# Patient Record
Sex: Female | Born: 1995 | Race: Black or African American | Hispanic: No | Marital: Single | Smoking: Former smoker
Health system: Southern US, Community
[De-identification: ages and names within clinical notes are randomized; demographics above are authoritative.]

## PROBLEM LIST (undated history)

## (undated) DIAGNOSIS — Z789 Other specified health status: Secondary | ICD-10-CM

## (undated) HISTORY — PX: MOUTH SURGERY: SHX715

---

## 2015-05-05 ENCOUNTER — Ambulatory Visit (HOSPITAL_COMMUNITY)
Admission: RE | Admit: 2015-05-05 | Discharge: 2015-05-05 | Disposition: A | Discharge status: Home / Self Care | Payer: Medicaid Other | Source: Ambulatory Visit | Attending: Obstetrics | Admitting: Obstetrics

## 2015-05-05 ENCOUNTER — Ambulatory Visit (INDEPENDENT_AMBULATORY_CARE_PROVIDER_SITE_OTHER): Payer: Medicaid Other | Admitting: Obstetrics

## 2015-05-05 ENCOUNTER — Encounter: Payer: Self-pay | Admitting: Obstetrics

## 2015-05-05 VITALS — BP 111/69 | HR 82 | Temp 98.4°F | Ht 61.0 in | Wt 146.2 lb

## 2015-05-05 DIAGNOSIS — O2341 Unspecified infection of urinary tract in pregnancy, first trimester: Secondary | ICD-10-CM

## 2015-05-05 DIAGNOSIS — O3680X1 Pregnancy with inconclusive fetal viability, fetus 1: Secondary | ICD-10-CM

## 2015-05-05 DIAGNOSIS — Z3401 Encounter for supervision of normal first pregnancy, first trimester: Secondary | ICD-10-CM

## 2015-05-05 DIAGNOSIS — Z3A11 11 weeks gestation of pregnancy: Secondary | ICD-10-CM | POA: Diagnosis not present

## 2015-05-05 LAB — TSH: TSH: 0.85 u[IU]/mL (ref 0.350–4.500)

## 2015-05-05 LAB — POCT URINALYSIS DIPSTICK
Bilirubin, UA: NEGATIVE
Blood, UA: NEGATIVE
Glucose, UA: NEGATIVE
KETONES UA: NEGATIVE
LEUKOCYTES UA: NEGATIVE
NITRITE UA: POSITIVE
Spec Grav, UA: 1.015
Urobilinogen, UA: NEGATIVE
pH, UA: 5.5

## 2015-05-05 LAB — POCT URINE PREGNANCY: Preg Test, Ur: POSITIVE — AB

## 2015-05-05 MED ORDER — NITROFURANTOIN MONOHYD MACRO 100 MG PO CAPS: 100.0000 mg | ORAL_CAPSULE | Freq: Two times a day (BID) | ORAL | Status: DC

## 2015-05-05 MED ORDER — VITAFOL ULTRA 29-0.6-0.4-200 MG PO CAPS: 1.0000 | ORAL_CAPSULE | Freq: Every day | ORAL | Status: DC

## 2015-05-05 NOTE — Progress Notes (Signed)
Subjective:    Rita Gilbert is being seen today for her first obstetrical visit.  This is not a planned pregnancy. She is at [redacted]w[redacted]d gestation. Her obstetrical history is significant for none. Relationship with FOB: significant other, not living together. Patient does intend to breast feed. Pregnancy history fully reviewed.  The information documented in the HPI was reviewed and verified.  Menstrual History: OB History    Gravida Para Term Preterm AB TAB SAB Ectopic Multiple Living   1                Patient's last menstrual period was 02/17/2015.    History reviewed. No pertinent past medical history.  Past Surgical History  Procedure Laterality Date  . Mouth surgery      wisdom teeth     (Not in a hospital admission) Not on File  Social History  Substance Use Topics  . Smoking status: Former Games developer  . Smokeless tobacco: Not on file  . Alcohol Use: No    Family History  Problem Relation Age of Onset  . Cancer Paternal Aunt      Review of Systems Constitutional: negative for weight loss Gastrointestinal: negative for vomiting Genitourinary:negative for genital lesions and vaginal discharge and dysuria Musculoskeletal:negative for back pain Behavioral/Psych: negative for abusive relationship, depression, illegal drug usage and tobacco use    Objective:    BP 111/69 mmHg  Pulse 82  Temp(Src) 98.4 F (36.9 C)  Ht  (1.549 m)  Wt 146 lb 3.2 oz (66.316 kg)  BMI 27.64 kg/m2  LMP 02/17/2015 General Appearance:    Alert, cooperative, no distress, appears stated age  Head:    Normocephalic, without obvious abnormality, atraumatic  Eyes:    PERRL, conjunctiva/corneas clear, EOM's intact, fundi    benign, both eyes  Ears:    Normal TM's and external ear canals, both ears  Nose:   Nares normal, septum midline, mucosa normal, no drainage    or sinus tenderness  Throat:   Lips, mucosa, and tongue normal; teeth and gums normal  Neck:   Supple, symmetrical, trachea  midline, no adenopathy;    thyroid:  no enlargement/tenderness/nodules; no carotid   bruit or JVD  Back:     Symmetric, no curvature, ROM normal, no CVA tenderness  Lungs:     Clear to auscultation bilaterally, respirations unlabored  Chest Wall:    No tenderness or deformity   Heart:    Regular rate and rhythm, S1 and S2 normal, no murmur, rub   or gallop  Breast Exam:    No tenderness, masses, or nipple abnormality  Abdomen:     Soft, non-tender, bowel sounds active all four quadrants,    no masses, no organomegaly  Genitalia:    Normal female without lesion, discharge or tenderness  Extremities:   Extremities normal, atraumatic, no cyanosis or edema  Pulses:   2+ and symmetric all extremities  Skin:   Skin color, texture, turgor normal, no rashes or lesions  Lymph nodes:   Cervical, supraclavicular, and axillary nodes normal  Neurologic:   CNII-XII intact, normal strength, sensation and reflexes    throughout      Lab Review Urine pregnancy test Labs reviewed yes Radiologic studies reviewed no Assessment:    Pregnancy at [redacted]w[redacted]d weeks    FHR not heard with doppler   Plan:   Ultrasound ordered for viability.    Prenatal vitamins.  Counseling provided regarding continued use of seat belts, cessation of alcohol consumption, smoking or  use of illicit drugs; infection precautions i.e., influenza/TDAP immunizations, toxoplasmosis,CMV, parvovirus, listeria and varicella; workplace safety, exercise during pregnancy; routine dental care, safe medications, sexual activity, hot tubs, saunas, pools, travel, caffeine use, fish and methlymercury, potential toxins, hair treatments, varicose veins Weight gain recommendations per IOM guidelines reviewed: underweight/BMI< 18.5--> gain 28 - 40 lbs; normal weight/BMI 18.5 - 24.9--> gain 25 - 35 lbs; overweight/BMI 25 - 29.9--> gain 15 - 25 lbs; obese/BMI >30->gain  11 - 20 lbs Problem list reviewed and updated. FIRST/CF mutation testing/NIPT/QUAD  SCREEN/fragile X/Ashkenazi Jewish population testing/Spinal muscular atrophy discussed: requested. Role of ultrasound in pregnancy discussed; fetal survey: requested. Amniocentesis discussed: not indicated. VBAC calculator score: VBAC consent form provided Meds ordered this encounter  Medications  . Prenatal Vit-Fe Fumarate-FA (MULTIVITAMIN-PRENATAL) 27-0.8 MG TABS tablet    Sig: Take 1 tablet by mouth daily at 12 noon.  . Prenat-Fe Poly-Methfol-FA-DHA (VITAFOL ULTRA) 29-0.6-0.4-200 MG CAPS    Sig: Take 1 capsule by mouth daily before breakfast.    Dispense:  90 capsule    Refill:  3   Orders Placed This Encounter  Procedures  . Culture, OB Urine  . SureSwab, Vaginosis/Vaginitis Plus  . US OB Comp Less 14 Wks    Standing Status: Future     Number of Occurrences:      Standing Expiration Date: 07/04/2016    Order Specific Question:  Reason for Exam (SYMPTOM  OR DIAGNOSIS REQUIRED)    Answer:  Viability.  Absent FHR with Doppler at ~ 11 weeks.    Order Specific Question:  Preferred imaging location?    Answer:  [facility]  . Obstetric panel  . HIV antibody  . Hemoglobinopathy evaluation  . Varicella zoster antibody, IgG  . Vit D  25 hydroxy (rtn osteoporosis monitoring)  . TSH  . POCT urine pregnancy  . POCT urinalysis dipstick    Follow up in 4 weeks.

## 2015-05-05 NOTE — Addendum Note (Signed)
Addended by: Coral Ceo A on: 05/05/2015 03:15 PM   Modules accepted: Orders

## 2015-05-06 LAB — OBSTETRIC PANEL
ANTIBODY SCREEN: NEGATIVE
BASOS PCT: 0 % (ref 0–1)
Basophils Absolute: 0 10*3/uL (ref 0.0–0.1)
EOS ABS: 0.2 10*3/uL (ref 0.0–0.7)
EOS PCT: 3 % (ref 0–5)
HEMATOCRIT: 35.2 % — AB (ref 36.0–46.0)
HEMOGLOBIN: 11.5 g/dL — AB (ref 12.0–15.0)
HEP B S AG: NEGATIVE
Lymphocytes Relative: 19 % (ref 12–46)
Lymphs Abs: 1.6 10*3/uL (ref 0.7–4.0)
MCH: 26.4 pg (ref 26.0–34.0)
MCHC: 32.7 g/dL (ref 30.0–36.0)
MCV: 80.7 fL (ref 78.0–100.0)
MONO ABS: 0.5 10*3/uL (ref 0.1–1.0)
MONOS PCT: 6 % (ref 3–12)
MPV: 9.7 fL (ref 8.6–12.4)
Neutro Abs: 5.9 10*3/uL (ref 1.7–7.7)
Neutrophils Relative %: 72 % (ref 43–77)
Platelets: 343 10*3/uL (ref 150–400)
RBC: 4.36 MIL/uL (ref 3.87–5.11)
RDW: 14.6 % (ref 11.5–15.5)
RH TYPE: NEGATIVE
Rubella: 0.73 Index (ref <0.90)
WBC: 8.2 10*3/uL (ref 4.0–10.5)

## 2015-05-06 LAB — VITAMIN D 25 HYDROXY (VIT D DEFICIENCY, FRACTURES): Vit D, 25-Hydroxy: 18 ng/mL — ABNORMAL LOW (ref 30–100)

## 2015-05-06 LAB — HIV ANTIBODY (ROUTINE TESTING W REFLEX): HIV 1&2 Ab, 4th Generation: NONREACTIVE

## 2015-05-06 LAB — VARICELLA ZOSTER ANTIBODY, IGG: Varicella IgG: 46.36 Index (ref <135.00)

## 2015-05-07 ENCOUNTER — Encounter: Payer: Medicaid Other | Admitting: Certified Nurse Midwife

## 2015-05-07 LAB — HEMOGLOBINOPATHY EVALUATION
HEMOGLOBIN OTHER: 0 %
HGB A2 QUANT: 2.5 % (ref 2.2–3.2)
HGB A: 97.5 % (ref 96.8–97.8)
HGB F QUANT: 0 % (ref 0.0–2.0)
Hgb S Quant: 0 %

## 2015-05-07 LAB — CULTURE, OB URINE: Colony Count: >=100000

## 2015-05-08 ENCOUNTER — Other Ambulatory Visit: Payer: Self-pay | Admitting: Obstetrics

## 2015-05-08 DIAGNOSIS — N390 Urinary tract infection, site not specified: Secondary | ICD-10-CM

## 2015-05-08 MED ORDER — SULFAMETHOXAZOLE-TRIMETHOPRIM 800-160 MG PO TABS: 1.0000 | ORAL_TABLET | Freq: Two times a day (BID) | ORAL | Status: DC

## 2015-05-09 ENCOUNTER — Other Ambulatory Visit: Payer: Self-pay | Admitting: Obstetrics

## 2015-05-09 LAB — SURESWAB, VAGINOSIS/VAGINITIS PLUS
ATOPOBIUM VAGINAE: 6.2 Log (cells/mL)
BV CATEGORY: UNDETERMINED — AB
C. ALBICANS, DNA: DETECTED — AB
C. TRACHOMATIS RNA, TMA: NOT DETECTED
C. glabrata, DNA: NOT DETECTED
C. parapsilosis, DNA: NOT DETECTED
C. tropicalis, DNA: NOT DETECTED
Gardnerella vaginalis: >8 Log (cells/mL)
LACTOBACILLUS SPECIES: 4.9 Log (cells/mL)
N. GONORRHOEAE RNA, TMA: NOT DETECTED
T. vaginalis RNA, QL TMA: NOT DETECTED

## 2015-06-02 ENCOUNTER — Ambulatory Visit (INDEPENDENT_AMBULATORY_CARE_PROVIDER_SITE_OTHER): Payer: Self-pay | Admitting: Obstetrics

## 2015-06-02 ENCOUNTER — Encounter: Payer: Self-pay | Admitting: Obstetrics

## 2015-06-02 VITALS — BP 111/72 | HR 87 | Temp 98.8°F | Wt 143.0 lb

## 2015-06-02 DIAGNOSIS — Z3402 Encounter for supervision of normal first pregnancy, second trimester: Secondary | ICD-10-CM

## 2015-06-02 DIAGNOSIS — N390 Urinary tract infection, site not specified: Secondary | ICD-10-CM

## 2015-06-02 MED ORDER — FLUCONAZOLE 150 MG PO TABS: 150.0000 mg | ORAL_TABLET | Freq: Once | ORAL | Status: DC

## 2015-06-02 NOTE — Progress Notes (Signed)
  Subjective:    Rita Gilbert is a 19 y.o. female being seen today for her obstetrical visit. She is at 2857w0d gestation. Patient reports: no complaints.  Problem List Items Addressed This Visit    None    Visit Diagnoses    UTI (lower urinary tract infection)    -  Primary    Relevant Medications    fluconazole (DIFLUCAN) 150 MG tablet    Encounter for supervision of normal first pregnancy in second trimester        Relevant Orders    POCT urinalysis dipstick    US OB Comp + 14 Wk    AFP, Quad Screen      There are no active problems to display for this patient.   Objective:     BP 111/72 mmHg  Pulse 87  Temp(Src) 98.8 F (37.1 C)  Wt 143 lb (64.864 kg)  LMP 02/17/2015 Uterine Size: Below umbilicus     Assessment:    Pregnancy @ 5057w0d  weeks Doing well    Plan:    Problem list reviewed and updated. Labs reviewed.  Follow up in 4 weeks. FIRST/CF mutation testing/NIPT/QUAD SCREEN/fragile X/Ashkenazi Jewish population testing/Spinal muscular atrophy discussed: requested. Role of ultrasound in pregnancy discussed; fetal survey: requested. Amniocentesis discussed: not indicated.

## 2015-06-04 ENCOUNTER — Encounter: Payer: Self-pay | Admitting: Obstetrics

## 2015-06-04 LAB — AFP, QUAD SCREEN
AFP: 21.3 ng/mL
Age Alone: 1:1180 {titer}
Curr Gest Age: 15 wks.days
HCG, Total: 50.94 IU/mL
INH: 167.2 pg/mL
INTERPRETATION-AFP: NEGATIVE
MOM FOR AFP: 0.62
MoM for INH: 0.89
MoM for hCG: 0.91
Open Spina bifida: NEGATIVE
TRI 18 SCR RISK EST: NEGATIVE
UE3 MOM: 1.5
UE3 VALUE: 0.96 ng/mL

## 2015-06-16 NOTE — Progress Notes (Signed)
Person can not except calls at this time.

## 2015-07-03 ENCOUNTER — Encounter: Payer: Self-pay | Admitting: Obstetrics

## 2015-07-03 ENCOUNTER — Other Ambulatory Visit: Payer: Medicaid Other

## 2015-07-03 ENCOUNTER — Ambulatory Visit (INDEPENDENT_AMBULATORY_CARE_PROVIDER_SITE_OTHER): Payer: Medicaid Other | Admitting: Obstetrics

## 2015-07-03 ENCOUNTER — Ambulatory Visit (HOSPITAL_COMMUNITY)
Admission: RE | Admit: 2015-07-03 | Discharge: 2015-07-03 | Disposition: A | Discharge status: Home / Self Care | Payer: Medicaid Other | Source: Ambulatory Visit | Attending: Obstetrics | Admitting: Obstetrics

## 2015-07-03 VITALS — BP 116/79 | HR 98 | Temp 98.7°F | Wt 150.0 lb

## 2015-07-03 DIAGNOSIS — Z6791 Unspecified blood type, Rh negative: Secondary | ICD-10-CM | POA: Insufficient documentation

## 2015-07-03 DIAGNOSIS — Z3402 Encounter for supervision of normal first pregnancy, second trimester: Secondary | ICD-10-CM

## 2015-07-03 DIAGNOSIS — Z3A19 19 weeks gestation of pregnancy: Secondary | ICD-10-CM | POA: Insufficient documentation

## 2015-07-03 DIAGNOSIS — K219 Gastro-esophageal reflux disease without esophagitis: Secondary | ICD-10-CM

## 2015-07-03 DIAGNOSIS — O26892 Other specified pregnancy related conditions, second trimester: Secondary | ICD-10-CM | POA: Diagnosis not present

## 2015-07-03 MED ORDER — RANITIDINE HCL 150 MG PO TABS: 150.0000 mg | ORAL_TABLET | Freq: Two times a day (BID) | ORAL | Status: DC

## 2015-07-03 NOTE — Progress Notes (Signed)
Subjective:    Rita Gilbert is a 19 y.o. female being seen today for her obstetrical visit. She is at 2852w3d gestation. Patient reports: no complaints . Fetal movement: normal.  Problem List Items Addressed This Visit    None    Visit Diagnoses    Encounter for supervision of normal first pregnancy in second trimester    -  Primary    Relevant Orders    POCT urinalysis dipstick      There are no active problems to display for this patient.  Objective:    BP 116/79 mmHg  Pulse 98  Temp(Src) 98.7 F (37.1 C)  Wt 150 lb (68.04 kg)  LMP 02/17/2015 FHT: 150 BPM  Uterine Size: size equals dates     Assessment:    Pregnancy @ 7652w3d    Plan:      Labs, problem list reviewed and updated 2 hr GTT planned Follow up in 4 weeks.

## 2015-07-03 NOTE — Progress Notes (Signed)
Pt states that she is having heartburn and reflux, request Rx.

## 2015-07-18 ENCOUNTER — Other Ambulatory Visit: Payer: Self-pay | Admitting: Certified Nurse Midwife

## 2015-07-31 ENCOUNTER — Encounter: Payer: Self-pay | Admitting: Obstetrics

## 2015-07-31 ENCOUNTER — Ambulatory Visit (INDEPENDENT_AMBULATORY_CARE_PROVIDER_SITE_OTHER): Payer: Medicaid Other | Admitting: Obstetrics

## 2015-07-31 VITALS — BP 118/81 | HR 97 | Wt 157.0 lb

## 2015-07-31 DIAGNOSIS — Z3402 Encounter for supervision of normal first pregnancy, second trimester: Secondary | ICD-10-CM

## 2015-07-31 NOTE — Progress Notes (Signed)
Subjective:    Rita Gilbert is a 19 y.o. female being seen today for her obstetrical visit. She is at 6069w3d gestation. Patient reports: no complaints . Fetal movement: normal.  Problem List Items Addressed This Visit    None    Visit Diagnoses    Encounter for supervision of normal first pregnancy in second trimester    -  Primary    Relevant Orders    POCT urinalysis dipstick      There are no active problems to display for this patient.  Objective:    BP 118/81 mmHg  Pulse 97  Wt 157 lb (71.215 kg)  LMP 02/17/2015 FHT: 150 BPM  Uterine Size: size equals dates     Assessment:    Pregnancy @ 7069w3d    Plan:    OBGCT: discussed.  Labs, problem list reviewed and updated 2 hr GTT planned Follow up in 4 weeks.

## 2015-08-17 NOTE — L&D Delivery Note (Signed)
Delivery Note This is a 20 year old G 1 P0 who was admitted for Active phase labor.. She progressed normally with epidural to the second stage of labor.  She pushed for 15 min.  At 9:04 AM she delivered a viable infant female, cephalic, over an intact perineum.  A nuchal cord   was identified, reduced on maternal perineium. Infant placed on maternal abdomen.  Delayed cord clamping was performed for 5-10 minutes.  Cord double clamped and cut.  Apgar scores were 9 and 9. The placenta delivered spontaneously, shultz, with a 3 vessel cord.  Inspection revealed 1st degree and no repair required. The uterus was firm bleeding stable.  EBL was 150.    Placenta and umbilical artery blood gas were not sent.  There were no complications during the procedure.  Mom and baby skin to skin following delivery. Left in stable condition.   Vaginal, Spontaneous Delivery (Presentation: ; Occiput Anterior).  APGAR: 9, 9; weight  .   Placenta status: Intact, Spontaneous, [place].  Cord: 3 vessels with the following complications: None.  Cord pH: N/A  Anesthesia: Epidural  Episiotomy: None Lacerations: None Suture Repair: none Est. Blood Loss (mL): 150  Mom to postpartum.  Baby to Couplet care / Skin to Skin.  Roe Coombsachelle A Georgann Bramble, CNM 11/20/2015, 9:37 AM

## 2015-08-27 ENCOUNTER — Encounter: Payer: Self-pay | Admitting: Obstetrics

## 2015-08-27 ENCOUNTER — Ambulatory Visit (INDEPENDENT_AMBULATORY_CARE_PROVIDER_SITE_OTHER): Payer: Medicaid Other | Admitting: Obstetrics

## 2015-08-27 ENCOUNTER — Other Ambulatory Visit: Payer: Medicaid Other

## 2015-08-27 VITALS — BP 115/73 | HR 91 | Temp 97.9°F | Wt 164.0 lb

## 2015-08-27 DIAGNOSIS — Z3402 Encounter for supervision of normal first pregnancy, second trimester: Secondary | ICD-10-CM

## 2015-08-27 LAB — CBC
HCT: 31.8 % — ABNORMAL LOW (ref 36.0–46.0)
HEMOGLOBIN: 10.2 g/dL — AB (ref 12.0–15.0)
MCH: 26.5 pg (ref 26.0–34.0)
MCHC: 32.1 g/dL (ref 30.0–36.0)
MCV: 82.6 fL (ref 78.0–100.0)
MPV: 10.3 fL (ref 8.6–12.4)
PLATELETS: 390 10*3/uL (ref 150–400)
RBC: 3.85 MIL/uL — AB (ref 3.87–5.11)
RDW: 15.3 % (ref 11.5–15.5)
WBC: 12.4 10*3/uL — AB (ref 4.0–10.5)

## 2015-08-27 LAB — POCT URINALYSIS DIPSTICK
Bilirubin, UA: NEGATIVE
Blood, UA: NEGATIVE
Glucose, UA: 50
Ketones, UA: NEGATIVE
LEUKOCYTES UA: NEGATIVE
Nitrite, UA: NEGATIVE
PROTEIN UA: NEGATIVE
Spec Grav, UA: 1.015
UROBILINOGEN UA: NEGATIVE
pH, UA: 7

## 2015-08-27 NOTE — Addendum Note (Signed)
Addended by: Henriette CombsHATTON, ANDREA L on: 08/27/2015 01:43 PM   Modules accepted: Orders

## 2015-08-27 NOTE — Progress Notes (Signed)
Lower abdomen pain and lower pain.

## 2015-08-27 NOTE — Progress Notes (Signed)
  Subjective:    Rita Gilbert is a 20 y.o. female being seen today for her obstetrical visit. She is at 8635w2d gestation. Patient reports: no complaints . Fetal movement: normal.  Problem List Items Addressed This Visit    None    Visit Diagnoses    Encounter for supervision of normal first pregnancy in second trimester    -  Primary    Relevant Orders    Glucose Tolerance, 2 Hours w/1 Hour    CBC    HIV antibody    RPR    POCT urinalysis dipstick      There are no active problems to display for this patient.  Objective:    BP 115/73 mmHg  Pulse 91  Temp(Src) 97.9 F (36.6 C)  Wt 164 lb (74.39 kg)  LMP 02/17/2015 FHT: 150 BPM  Uterine Size: size equals dates     Assessment:    Pregnancy @ 7135w2d    Plan:    OBGCT: discussed and ordered.  Labs, problem list reviewed and updated 2 hr GTT planned Follow up in 2 weeks.

## 2015-08-28 ENCOUNTER — Encounter: Payer: Medicaid Other | Admitting: Obstetrics

## 2015-08-28 ENCOUNTER — Other Ambulatory Visit: Payer: Medicaid Other

## 2015-08-28 LAB — HIV ANTIBODY (ROUTINE TESTING W REFLEX): HIV: NONREACTIVE

## 2015-08-28 LAB — GLUCOSE TOLERANCE, 2 HOURS W/ 1HR
GLUCOSE, 2 HOUR: 75 mg/dL (ref 70–139)
GLUCOSE: 107 mg/dL (ref 70–170)
Glucose, Fasting: 76 mg/dL (ref 65–99)

## 2015-08-29 LAB — RPR

## 2015-09-09 ENCOUNTER — Encounter: Payer: Medicaid Other | Admitting: Obstetrics

## 2015-09-11 ENCOUNTER — Ambulatory Visit (INDEPENDENT_AMBULATORY_CARE_PROVIDER_SITE_OTHER): Payer: Medicaid Other | Admitting: Obstetrics

## 2015-09-11 ENCOUNTER — Encounter: Payer: Self-pay | Admitting: Obstetrics

## 2015-09-11 VITALS — BP 123/78 | HR 101 | Wt 167.0 lb

## 2015-09-11 DIAGNOSIS — Z3403 Encounter for supervision of normal first pregnancy, third trimester: Secondary | ICD-10-CM

## 2015-09-11 NOTE — Progress Notes (Signed)
Subjective:    Rita Gilbert is a 20 y.o. female being seen today for her obstetrical visit. She is at [redacted]w[redacted]d gestation. Patient reports no complaints. Fetal movement: normal.  Problem List Items Addressed This Visit    None    Visit Diagnoses    Encounter for supervision of normal first pregnancy in third trimester    -  Primary    Relevant Orders    POCT urinalysis dipstick      There are no active problems to display for this patient.  Objective:    BP 123/78 mmHg  Pulse 101  Wt 167 lb (75.751 kg)  LMP 02/17/2015 FHT:  150 BPM  Uterine Size: size equals dates  Presentation: unsure     Assessment:    Pregnancy @ [redacted]w[redacted]d weeks   Plan:     labs reviewed, problem list updated Consent signed. GBS sent TDAP offered  Rhogam given for RH negative Pediatrician: discussed. Infant feeding: plans to breastfeed. Maternity leave: discussed. Cigarette smoking: former smoker. Orders Placed This Encounter  Procedures  . POCT urinalysis dipstick   No orders of the defined types were placed in this encounter.   Follow up in 2 Weeks.

## 2015-09-11 NOTE — Patient Instructions (Signed)

## 2015-09-11 NOTE — Progress Notes (Signed)
Pt is having increase in pelvic pain. 

## 2015-09-29 ENCOUNTER — Ambulatory Visit (INDEPENDENT_AMBULATORY_CARE_PROVIDER_SITE_OTHER): Payer: Medicaid Other | Admitting: Obstetrics

## 2015-09-29 VITALS — BP 127/81 | HR 97 | Temp 98.6°F | Wt 170.6 lb

## 2015-09-29 DIAGNOSIS — Z3403 Encounter for supervision of normal first pregnancy, third trimester: Secondary | ICD-10-CM

## 2015-09-29 DIAGNOSIS — O360130 Maternal care for anti-D [Rh] antibodies, third trimester, not applicable or unspecified: Secondary | ICD-10-CM

## 2015-09-29 LAB — POCT URINALYSIS DIPSTICK
Bilirubin, UA: NEGATIVE
Blood, UA: NEGATIVE
GLUCOSE UA: NEGATIVE
KETONES UA: NEGATIVE
LEUKOCYTES UA: NEGATIVE
Nitrite, UA: NEGATIVE
Spec Grav, UA: 1.02
Urobilinogen, UA: NEGATIVE
pH, UA: 5

## 2015-09-29 MED ORDER — RHO D IMMUNE GLOBULIN 1500 UNIT/2ML IJ SOSY
300.0000 ug | PREFILLED_SYRINGE | Freq: Once | INTRAMUSCULAR | Status: AC
  Administered 2015-09-29: 300 ug via INTRAMUSCULAR

## 2015-09-29 NOTE — Progress Notes (Signed)
Patient reports she is doing well today. 

## 2015-09-30 ENCOUNTER — Encounter: Payer: Self-pay | Admitting: Obstetrics

## 2015-10-01 ENCOUNTER — Encounter: Payer: Self-pay | Admitting: Obstetrics

## 2015-10-01 NOTE — Progress Notes (Signed)
Subjective:    Rita Gilbert is a 20 y.o. female being seen today for her obstetrical visit. She is at [redacted]w[redacted]d gestation. Patient reports no complaints. Fetal movement: normal.  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  Objective:    BP 127/81 mmHg  Pulse 97  Temp(Src) 98.6 F (37 C)  Wt 170 lb 9.6 oz (77.384 kg)  LMP 02/17/2015 FHT:  150 BPM  Uterine Size: size equals dates  Presentation: unsure     Assessment:    Pregnancy @ [redacted]w[redacted]d weeks   Plan:     labs reviewed, problem list updated Consent signed. GBS sent TDAP offered  Rhogam given for RH negative Pediatrician: discussed. Infant feeding: plans to breastfeed. Maternity leave: discussed. Cigarette smoking: former smoker. Orders Placed This Encounter  Procedures  . POCT urinalysis dipstick   Meds ordered this encounter  Medications  . rho (d) immune globulin (RHIG/RHOPHYLAC) injection 300 mcg    Sig:    Follow up in 2 Weeks.

## 2015-10-13 ENCOUNTER — Ambulatory Visit (INDEPENDENT_AMBULATORY_CARE_PROVIDER_SITE_OTHER): Payer: Medicaid Other | Admitting: Obstetrics

## 2015-10-13 VITALS — BP 123/80 | HR 98 | Temp 98.1°F | Wt 172.0 lb

## 2015-10-13 DIAGNOSIS — Z3403 Encounter for supervision of normal first pregnancy, third trimester: Secondary | ICD-10-CM

## 2015-10-14 ENCOUNTER — Encounter: Payer: Self-pay | Admitting: Obstetrics

## 2015-10-14 NOTE — Progress Notes (Signed)
Subjective:    Rita Gilbert is a 20 y.o. female being seen today for her obstetrical visit. She is at [redacted]w[redacted]d gestation. Patient reports no complaints. Fetal movement: normal.  Problem List Items Addressed This Visit    None    Visit Diagnoses    Encounter for supervision of normal first pregnancy in third trimester    -  Primary    Relevant Orders    POCT urinalysis dipstick      There are no active problems to display for this patient.  Objective:    BP 123/80 mmHg  Pulse 98  Temp(Src) 98.1 F (36.7 C)  Wt 172 lb (78.019 kg)  LMP 02/17/2015 FHT:  150 BPM  Uterine Size: size equals dates  Presentation: unsure     Assessment:    Pregnancy @ [redacted]w[redacted]d weeks   Plan:     labs reviewed, problem list updated Consent signed. GBS sent TDAP offered  Rhogam given for RH negative Pediatrician: discussed. Infant feeding: plans to breastfeed. Maternity leave: discussed. Cigarette smoking: former smoker. Orders Placed This Encounter  Procedures  . POCT urinalysis dipstick   No orders of the defined types were placed in this encounter.   Follow up in 1 Week.

## 2015-10-23 ENCOUNTER — Ambulatory Visit (INDEPENDENT_AMBULATORY_CARE_PROVIDER_SITE_OTHER): Payer: Medicaid Other | Admitting: Obstetrics

## 2015-10-23 VITALS — BP 119/79 | HR 105 | Wt 175.0 lb

## 2015-10-23 DIAGNOSIS — Z3403 Encounter for supervision of normal first pregnancy, third trimester: Secondary | ICD-10-CM

## 2015-10-24 NOTE — Progress Notes (Signed)
Subjective:    Rita Gilbert is a 20 y.o. female being seen today for her obstetrical visit. She is at 8552w4d gestation. Patient reports no complaints. Fetal movement: normal.  Problem List Items Addressed This Visit    None    Visit Diagnoses    Encounter for supervision of normal first pregnancy in third trimester    -  Primary    Relevant Orders    POCT urinalysis dipstick    Strep B DNA probe      There are no active problems to display for this patient.  Objective:    BP 119/79 mmHg  Pulse 105  Wt 175 lb (79.379 kg)  LMP 02/17/2015 FHT:  150 BPM  Uterine Size: size equals dates  Presentation: unsure     Assessment:    Pregnancy @ 6352w4d weeks   Plan:     labs reviewed, problem list updated Consent signed. GBS sent TDAP offered  Rhogam given for RH negative Pediatrician: discussed. Infant feeding: plans to breastfeed. Maternity leave: discussed. Cigarette smoking: former smoker. Orders Placed This Encounter  Procedures  . Strep B DNA probe  . POCT urinalysis dipstick   No orders of the defined types were placed in this encounter.   Follow up in 1 Week.

## 2015-10-25 LAB — STREP B DNA PROBE: STREP GROUP B AG: NOT DETECTED

## 2015-10-30 ENCOUNTER — Encounter: Payer: Medicaid Other | Admitting: Obstetrics

## 2015-10-31 ENCOUNTER — Ambulatory Visit (INDEPENDENT_AMBULATORY_CARE_PROVIDER_SITE_OTHER): Payer: Medicaid Other | Admitting: Certified Nurse Midwife

## 2015-10-31 VITALS — BP 130/83 | HR 86 | Wt 178.0 lb

## 2015-10-31 DIAGNOSIS — Z3403 Encounter for supervision of normal first pregnancy, third trimester: Secondary | ICD-10-CM

## 2015-10-31 NOTE — Progress Notes (Signed)
Subjective:    Rita Gilbert is a 20 y.o. female being seen today for her obstetrical visit. She is at 8873w4d gestation. Patient reports no complaints. Fetal movement: normal.  Employed.    Problem List Items Addressed This Visit    None    Visit Diagnoses    Encounter for supervision of normal first pregnancy in third trimester    -  Primary    Relevant Orders    POCT urinalysis dipstick      There are no active problems to display for this patient.   Objective:    BP 130/83 mmHg  Pulse 86  Wt 178 lb (80.74 kg)  LMP 02/17/2015 FHT: 140 BPM  Uterine Size: size equals dates  Presentations: cephalic  Pelvic Exam: deferred     Assessment:    Pregnancy @ 3473w4d weeks   Doing well.    Plan:   Plans for delivery: Vaginal anticipated; labs reviewed; problem list updated Counseling: Consent signed. Infant feeding: plans to breastfeed. Cigarette smoking: never smoked. L&D discussion: symptoms of labor, discussed when to call, discussed what number to call, anesthetic/analgesic options reviewed and delivering clinician:  plans no preference. Postpartum supports and preparation: circumcision discussed and contraception plans discussed.  Follow up in 1 Week.

## 2015-11-07 ENCOUNTER — Ambulatory Visit (INDEPENDENT_AMBULATORY_CARE_PROVIDER_SITE_OTHER): Payer: Medicaid Other | Admitting: Certified Nurse Midwife

## 2015-11-07 ENCOUNTER — Encounter: Payer: Medicaid Other | Admitting: Certified Nurse Midwife

## 2015-11-07 VITALS — BP 107/79 | HR 109 | Temp 98.1°F | Wt 180.0 lb

## 2015-11-07 DIAGNOSIS — Z3403 Encounter for supervision of normal first pregnancy, third trimester: Secondary | ICD-10-CM

## 2015-11-07 NOTE — Progress Notes (Signed)
Subjective:    Rita Gilbert is a 20 y.o. female being seen today for her obstetrical visit. She is at 746w4d gestation. Patient reports no bleeding, no cramping, no leaking and occasional contractions, less than 3/hr. Fetal movement: normal.  Problem List Items Addressed This Visit    None    Visit Diagnoses    Encounter for supervision of normal first pregnancy in third trimester    -  Primary    Relevant Orders    POCT urinalysis dipstick      There are no active problems to display for this patient.   Objective:    BP 107/79 mmHg  Pulse 109  Temp(Src) 98.1 F (36.7 C)  Wt 180 lb (81.647 kg)  LMP 02/17/2015 FHT: 136 BPM  Uterine Size: size equals dates  Presentations: cephalic  Pelvic Exam:              Dilation: 0.5       Effacement: 25%             Station:  -3    Consistency: soft            Position: side anterior/middle     Assessment:    Pregnancy @ 3746w4d weeks   Doing well  Plan:   Plans for delivery: Vaginal anticipated; labs reviewed; problem list updated Counseling: Consent signed. Infant feeding: plans to breastfeed. Cigarette smoking: never smoked. L&D discussion: symptoms of labor, discussed when to call, discussed what number to call, anesthetic/analgesic options reviewed and delivering clinician:  plans no preference. Postpartum supports and preparation: circumcision discussed and contraception plans discussed.  Follow up in 1 Week.

## 2015-11-07 NOTE — Progress Notes (Signed)
Pt is having irregular contractions.

## 2015-11-10 ENCOUNTER — Other Ambulatory Visit: Payer: Self-pay | Admitting: Certified Nurse Midwife

## 2015-11-14 ENCOUNTER — Ambulatory Visit (INDEPENDENT_AMBULATORY_CARE_PROVIDER_SITE_OTHER): Payer: Medicaid Other | Admitting: Certified Nurse Midwife

## 2015-11-14 ENCOUNTER — Encounter: Payer: Self-pay | Admitting: *Deleted

## 2015-11-14 ENCOUNTER — Encounter: Payer: Medicaid Other | Admitting: Certified Nurse Midwife

## 2015-11-14 VITALS — BP 126/80 | HR 84 | Wt 181.0 lb

## 2015-11-14 DIAGNOSIS — Z3403 Encounter for supervision of normal first pregnancy, third trimester: Secondary | ICD-10-CM

## 2015-11-14 LAB — POCT URINALYSIS DIPSTICK
BILIRUBIN UA: NEGATIVE
GLUCOSE UA: NEGATIVE
Ketones, UA: NEGATIVE
Leukocytes, UA: NEGATIVE
NITRITE UA: NEGATIVE
PH UA: 6.5
RBC UA: NEGATIVE
SPEC GRAV UA: 1.02
Urobilinogen, UA: NEGATIVE

## 2015-11-14 NOTE — Progress Notes (Signed)
Subjective:    Lang SnowLyndsay Killilea is a 20 y.o. female being seen today for her obstetrical visit. She is at 3561w4d gestation. Patient reports no complaints. Fetal movement: normal.  Problem List Items Addressed This Visit    None    Visit Diagnoses    Encounter for supervision of normal first pregnancy in third trimester    -  Primary    Relevant Orders    POCT urinalysis dipstick      There are no active problems to display for this patient.   Objective:    BP 126/80 mmHg  Pulse 84  Wt 181 lb (82.101 kg)  LMP 02/17/2015 FHT: 135 BPM  Uterine Size: size equals dates  Presentations: cephalic  Pelvic Exam:              Dilation: Closed       Effacement: Long             Station:  -3    Consistency: soft            Position: posterior     Assessment:    Pregnancy @ 4861w4d weeks   doing well  Plan:    Out of work note completed per patient request Plans for delivery: Vaginal anticipated; labs reviewed; problem list updated Counseling: Consent signed. Infant feeding: plans to breastfeed. Cigarette smoking: never smoked. L&D discussion: symptoms of labor, discussed when to call, discussed what number to call, anesthetic/analgesic options reviewed and delivering clinician:  plans no preference. Postpartum supports and preparation: circumcision discussed and contraception plans discussed.  Follow up in 1 Week.

## 2015-11-17 ENCOUNTER — Encounter: Payer: Self-pay | Admitting: Obstetrics

## 2015-11-17 ENCOUNTER — Ambulatory Visit (INDEPENDENT_AMBULATORY_CARE_PROVIDER_SITE_OTHER): Payer: Medicaid Other | Admitting: Obstetrics

## 2015-11-17 VITALS — BP 116/73 | HR 102 | Temp 98.7°F | Wt 180.0 lb

## 2015-11-17 DIAGNOSIS — Z3403 Encounter for supervision of normal first pregnancy, third trimester: Secondary | ICD-10-CM

## 2015-11-17 NOTE — Progress Notes (Signed)
Patient states she is ready to have her baby. Patient spilled urine sample before seeing provider- will try to leave sample before leaving.

## 2015-11-17 NOTE — Progress Notes (Signed)
Subjective:    Rita Gilbert is a 20 y.o. female being seen today for her obstetrical visit. She is at 2541w0d gestation. Patient reports no complaints. Fetal movement: normal.  Problem List Items Addressed This Visit    None    Visit Diagnoses    Encounter for supervision of normal first pregnancy in third trimester    -  Primary    Relevant Orders    POCT urinalysis dipstick      There are no active problems to display for this patient.   Objective:    BP 116/73 mmHg  Pulse 102  Temp(Src) 98.7 F (37.1 C)  Wt 180 lb (81.647 kg)  LMP 02/17/2015 FHT: 150 BPM  Uterine Size: size equals dates  Presentations: cephalic    Assessment:    Pregnancy @ 6141w0d weeks   Plan:   Plans for delivery: Vaginal anticipated; labs reviewed; problem list updated Counseling: Consent signed. Infant feeding: plans to breastfeed. Cigarette smoking: never smoked. L&D discussion: symptoms of labor, discussed when to call, discussed what number to call, anesthetic/analgesic options reviewed and delivering clinician:  plans no preference. Postpartum supports and preparation: circumcision discussed and contraception plans discussed.  Follow up in 1 Week.

## 2015-11-19 ENCOUNTER — Inpatient Hospital Stay (HOSPITAL_COMMUNITY)
Admission: AD | Admit: 2015-11-19 | Discharge: 2015-11-22 | DRG: 775 | Disposition: A | Discharge status: Home / Self Care | Payer: Medicaid Other | Source: Ambulatory Visit | Attending: Obstetrics | Admitting: Obstetrics

## 2015-11-19 ENCOUNTER — Encounter (HOSPITAL_COMMUNITY): Payer: Self-pay

## 2015-11-19 DIAGNOSIS — K219 Gastro-esophageal reflux disease without esophagitis: Secondary | ICD-10-CM | POA: Diagnosis present

## 2015-11-19 DIAGNOSIS — Z6834 Body mass index (BMI) 34.0-34.9, adult: Secondary | ICD-10-CM

## 2015-11-19 DIAGNOSIS — O9962 Diseases of the digestive system complicating childbirth: Secondary | ICD-10-CM | POA: Diagnosis present

## 2015-11-19 DIAGNOSIS — Z3A39 39 weeks gestation of pregnancy: Secondary | ICD-10-CM | POA: Diagnosis not present

## 2015-11-19 DIAGNOSIS — O99214 Obesity complicating childbirth: Secondary | ICD-10-CM | POA: Diagnosis present

## 2015-11-19 DIAGNOSIS — Z87891 Personal history of nicotine dependence: Secondary | ICD-10-CM

## 2015-11-19 DIAGNOSIS — E669 Obesity, unspecified: Secondary | ICD-10-CM | POA: Diagnosis present

## 2015-11-19 HISTORY — DX: Other specified health status: Z78.9

## 2015-11-19 LAB — CBC
HCT: 30 % — ABNORMAL LOW (ref 36.0–46.0)
HEMOGLOBIN: 9.7 g/dL — AB (ref 12.0–15.0)
MCH: 25.9 pg — AB (ref 26.0–34.0)
MCHC: 32.3 g/dL (ref 30.0–36.0)
MCV: 80.2 fL (ref 78.0–100.0)
PLATELETS: 334 10*3/uL (ref 150–400)
RBC: 3.74 MIL/uL — AB (ref 3.87–5.11)
RDW: 16.1 % — ABNORMAL HIGH (ref 11.5–15.5)
WBC: 13.8 10*3/uL — AB (ref 4.0–10.5)

## 2015-11-19 MED ORDER — CITRIC ACID-SODIUM CITRATE 334-500 MG/5ML PO SOLN: 30.0000 mL | ORAL | Status: DC | PRN

## 2015-11-19 MED ORDER — OXYTOCIN 10 UNIT/ML IJ SOLN
2.5000 [IU]/h | INTRAVENOUS | Status: DC
  Filled 2015-11-19: qty 4

## 2015-11-19 MED ORDER — OXYTOCIN BOLUS FROM INFUSION: 500.0000 mL | INTRAVENOUS | Status: DC

## 2015-11-19 MED ORDER — OXYCODONE-ACETAMINOPHEN 5-325 MG PO TABS: 1.0000 | ORAL_TABLET | ORAL | Status: DC | PRN

## 2015-11-19 MED ORDER — OXYCODONE-ACETAMINOPHEN 5-325 MG PO TABS: 2.0000 | ORAL_TABLET | ORAL | Status: DC | PRN

## 2015-11-19 MED ORDER — LACTATED RINGERS IV SOLN: 500.0000 mL | INTRAVENOUS | Status: DC | PRN

## 2015-11-19 MED ORDER — FLEET ENEMA 7-19 GM/118ML RE ENEM: 1.0000 | ENEMA | RECTAL | Status: DC | PRN

## 2015-11-19 MED ORDER — ONDANSETRON HCL 4 MG/2ML IJ SOLN: 4.0000 mg | Freq: Four times a day (QID) | INTRAMUSCULAR | Status: DC | PRN

## 2015-11-19 MED ORDER — LIDOCAINE HCL (PF) 1 % IJ SOLN
30.0000 mL | INTRAMUSCULAR | Status: DC | PRN
  Filled 2015-11-19: qty 30

## 2015-11-19 MED ORDER — LACTATED RINGERS IV SOLN: INTRAVENOUS | Status: DC

## 2015-11-19 MED ORDER — ACETAMINOPHEN 325 MG PO TABS: 650.0000 mg | ORAL_TABLET | ORAL | Status: DC | PRN

## 2015-11-19 NOTE — Progress Notes (Signed)
Notified of pt arrival in MAU and exam. Will admit to labor and delivery with epidural orders 

## 2015-11-19 NOTE — MAU Note (Signed)
Pt reports contractions are 5 minutes apart and painful. Denies bleeding or ROM.

## 2015-11-19 NOTE — H&P (Signed)
Rita Gilbert is a 20 y.o. female presenting for UC's. Maternal Medical History:  Reason for admission: Contractions.   Contractions: Frequency: regular.   Perceived severity is moderate.    Fetal activity: Perceived fetal activity is normal.   Last perceived fetal movement was within the past hour.    Prenatal complications: no prenatal complications Prenatal Complications - Diabetes: none.    OB History    Gravida Para Term Preterm AB TAB SAB Ectopic Multiple Living   1              History reviewed. No pertinent past medical history. Past Surgical History  Procedure Laterality Date  . Mouth surgery      wisdom teeth   Family History: family history includes Cancer in her paternal aunt. Social History:  reports that she has quit smoking. She does not have any smokeless tobacco history on file. She reports that she does not drink alcohol or use illicit drugs.   Prenatal Transfer Tool  Maternal Diabetes: No Genetic Screening: Normal Maternal Ultrasounds/Referrals: Normal Fetal Ultrasounds or other Referrals:  None Maternal Substance Abuse:  No Significant Maternal Medications:  None Significant Maternal Lab Results:  None Other Comments:  None  Review of Systems  All other systems reviewed and are negative.   Dilation: 4 Effacement (%): 80 Station: 0 Exam by:: Sharen Hintaroline Brewer RNC Blood pressure 127/86, pulse 99, temperature 97.8 F (36.6 C), temperature source Oral, resp. rate 16, height 5' 1.5" (1.562 m), weight 185 lb (83.915 kg), last menstrual period 02/17/2015, SpO2 98 %. Maternal Exam:  Abdomen: Patient reports no abdominal tenderness. Fetal presentation: vertex  Cervix: Cervix evaluated by digital exam.     Physical Exam  Nursing note and vitals reviewed. Constitutional: She is oriented to person, place, and time. She appears well-developed and well-nourished.  HENT:  Head: Normocephalic and atraumatic.  Eyes: Conjunctivae are normal. Pupils are  equal, round, and reactive to light.  Neck: Normal range of motion. Neck supple.  Cardiovascular: Normal rate and regular rhythm.   Respiratory: Effort normal and breath sounds normal.  GI: Soft. Bowel sounds are normal.  Genitourinary: Vagina normal and uterus normal.  Musculoskeletal: Normal range of motion.  Neurological: She is alert and oriented to person, place, and time.  Skin: Skin is warm and dry.  Psychiatric: She has a normal mood and affect. Her behavior is normal. Judgment and thought content normal.    Prenatal labs: ABO, Rh: O/NEG/-- (09/19 1100) Antibody: NEG (09/19 1100) Rubella: 0.73 (09/19 1100) RPR: NON REAC (01/11 1003)  HBsAg: NEGATIVE (09/19 1100)  HIV: NONREACTIVE (01/11 1003)  GBS: NOT DETECTED (03/09 1152)   Assessment/Plan: 39 weeks.  Early labor.  Admit.   HARPER,CHARLES A 11/19/2015, 11:14 PM

## 2015-11-20 ENCOUNTER — Encounter (HOSPITAL_COMMUNITY): Payer: Self-pay | Admitting: Anesthesiology

## 2015-11-20 ENCOUNTER — Inpatient Hospital Stay (HOSPITAL_COMMUNITY): Payer: Medicaid Other | Admitting: Anesthesiology

## 2015-11-20 LAB — TYPE AND SCREEN
ABO/RH(D): O NEG
ANTIBODY SCREEN: NEGATIVE

## 2015-11-20 LAB — ABO/RH: ABO/RH(D): O NEG

## 2015-11-20 MED ORDER — IBUPROFEN 600 MG PO TABS
600.0000 mg | ORAL_TABLET | Freq: Four times a day (QID) | ORAL | Status: DC
  Administered 2015-11-20 – 2015-11-22 (×8): 600 mg via ORAL
  Filled 2015-11-20 (×8): qty 1

## 2015-11-20 MED ORDER — ACETAMINOPHEN 325 MG PO TABS: 650.0000 mg | ORAL_TABLET | ORAL | Status: DC | PRN

## 2015-11-20 MED ORDER — METHYLERGONOVINE MALEATE 0.2 MG PO TABS: 0.2000 mg | ORAL_TABLET | ORAL | Status: DC | PRN

## 2015-11-20 MED ORDER — DIPHENHYDRAMINE HCL 50 MG/ML IJ SOLN: 12.5000 mg | INTRAMUSCULAR | Status: DC | PRN

## 2015-11-20 MED ORDER — ONDANSETRON HCL 4 MG PO TABS: 4.0000 mg | ORAL_TABLET | ORAL | Status: DC | PRN

## 2015-11-20 MED ORDER — FENTANYL 2.5 MCG/ML BUPIVACAINE 1/10 % EPIDURAL INFUSION (WH - ANES)
INTRAMUSCULAR | Status: AC
  Filled 2015-11-20: qty 125

## 2015-11-20 MED ORDER — NALBUPHINE HCL 10 MG/ML IJ SOLN: 10.0000 mg | Freq: Four times a day (QID) | INTRAMUSCULAR | Status: DC | PRN

## 2015-11-20 MED ORDER — LANOLIN HYDROUS EX OINT: TOPICAL_OINTMENT | CUTANEOUS | Status: DC | PRN

## 2015-11-20 MED ORDER — ZOLPIDEM TARTRATE 5 MG PO TABS: 5.0000 mg | ORAL_TABLET | Freq: Every evening | ORAL | Status: DC | PRN

## 2015-11-20 MED ORDER — DIPHENHYDRAMINE HCL 25 MG PO CAPS: 25.0000 mg | ORAL_CAPSULE | Freq: Four times a day (QID) | ORAL | Status: DC | PRN

## 2015-11-20 MED ORDER — BISACODYL 10 MG RE SUPP: 10.0000 mg | Freq: Every day | RECTAL | Status: DC | PRN

## 2015-11-20 MED ORDER — WITCH HAZEL-GLYCERIN EX PADS: 1.0000 "application " | MEDICATED_PAD | CUTANEOUS | Status: DC | PRN

## 2015-11-20 MED ORDER — ONDANSETRON HCL 4 MG/2ML IJ SOLN
4.0000 mg | INTRAMUSCULAR | Status: DC | PRN
Start: 2015-11-20 — End: 2015-11-22

## 2015-11-20 MED ORDER — OXYCODONE-ACETAMINOPHEN 5-325 MG PO TABS: 1.0000 | ORAL_TABLET | ORAL | Status: DC | PRN

## 2015-11-20 MED ORDER — MEASLES, MUMPS & RUBELLA VAC ~~LOC~~ INJ
0.5000 mL | INJECTION | Freq: Once | SUBCUTANEOUS | Status: DC
  Filled 2015-11-20: qty 0.5

## 2015-11-20 MED ORDER — LIDOCAINE HCL (PF) 1 % IJ SOLN
INTRAMUSCULAR | Status: DC | PRN
  Administered 2015-11-20 (×2): 4 mL via EPIDURAL

## 2015-11-20 MED ORDER — FERROUS SULFATE 325 (65 FE) MG PO TABS
325.0000 mg | ORAL_TABLET | Freq: Two times a day (BID) | ORAL | Status: DC
Start: 2015-11-20 — End: 2015-11-22
  Administered 2015-11-20 – 2015-11-22 (×4): 325 mg via ORAL
  Filled 2015-11-20 (×4): qty 1

## 2015-11-20 MED ORDER — NALBUPHINE HCL 10 MG/ML IJ SOLN: 10.0000 mg | INTRAMUSCULAR | Status: DC | PRN

## 2015-11-20 MED ORDER — EPHEDRINE 5 MG/ML INJ
10.0000 mg | INTRAVENOUS | Status: DC | PRN
  Filled 2015-11-20: qty 2

## 2015-11-20 MED ORDER — TERBUTALINE SULFATE 1 MG/ML IJ SOLN
0.2500 mg | Freq: Once | INTRAMUSCULAR | Status: DC | PRN
  Filled 2015-11-20: qty 1

## 2015-11-20 MED ORDER — PRENATAL MULTIVITAMIN CH
1.0000 | ORAL_TABLET | Freq: Every day | ORAL | Status: DC
  Administered 2015-11-20 – 2015-11-22 (×3): 1 via ORAL
  Filled 2015-11-20 (×3): qty 1

## 2015-11-20 MED ORDER — PHENYLEPHRINE 40 MCG/ML (10ML) SYRINGE FOR IV PUSH (FOR BLOOD PRESSURE SUPPORT)
80.0000 ug | PREFILLED_SYRINGE | INTRAVENOUS | Status: DC | PRN
  Filled 2015-11-20: qty 2

## 2015-11-20 MED ORDER — PHENYLEPHRINE 40 MCG/ML (10ML) SYRINGE FOR IV PUSH (FOR BLOOD PRESSURE SUPPORT)
PREFILLED_SYRINGE | INTRAVENOUS | Status: AC
  Filled 2015-11-20: qty 20

## 2015-11-20 MED ORDER — VITAFOL ULTRA 29-0.6-0.4-200 MG PO CAPS
1.0000 | ORAL_CAPSULE | Freq: Every day | ORAL | Status: DC
Start: 2015-11-21 — End: 2015-11-20

## 2015-11-20 MED ORDER — OXYTOCIN 10 UNIT/ML IJ SOLN
1.0000 m[IU]/min | INTRAVENOUS | Status: DC
  Administered 2015-11-20: 2 m[IU]/min via INTRAVENOUS
  Administered 2015-11-20: 666 m[IU]/min via INTRAVENOUS

## 2015-11-20 MED ORDER — SIMETHICONE 80 MG PO CHEW
80.0000 mg | CHEWABLE_TABLET | ORAL | Status: DC | PRN
Start: 2015-11-20 — End: 2015-11-22

## 2015-11-20 MED ORDER — MEDROXYPROGESTERONE ACETATE 150 MG/ML IM SUSP: 150.0000 mg | INTRAMUSCULAR | Status: DC | PRN

## 2015-11-20 MED ORDER — FLEET ENEMA 7-19 GM/118ML RE ENEM: 1.0000 | ENEMA | Freq: Every day | RECTAL | Status: DC | PRN

## 2015-11-20 MED ORDER — FAMOTIDINE 20 MG PO TABS
20.0000 mg | ORAL_TABLET | Freq: Two times a day (BID) | ORAL | Status: DC
  Filled 2015-11-20: qty 1

## 2015-11-20 MED ORDER — PROCHLORPERAZINE EDISYLATE 5 MG/ML IJ SOLN
10.0000 mg | Freq: Four times a day (QID) | INTRAMUSCULAR | Status: DC | PRN
  Filled 2015-11-20: qty 2

## 2015-11-20 MED ORDER — BENZOCAINE-MENTHOL 20-0.5 % EX AERO: 1.0000 "application " | INHALATION_SPRAY | CUTANEOUS | Status: DC | PRN

## 2015-11-20 MED ORDER — LACTATED RINGERS IV SOLN
500.0000 mL | Freq: Once | INTRAVENOUS | Status: AC
  Administered 2015-11-20: 500 mL via INTRAVENOUS

## 2015-11-20 MED ORDER — FENTANYL 2.5 MCG/ML BUPIVACAINE 1/10 % EPIDURAL INFUSION (WH - ANES)
14.0000 mL/h | INTRAMUSCULAR | Status: DC | PRN
  Administered 2015-11-20: 13 mL/h via EPIDURAL
  Administered 2015-11-20: 14 mL/h via EPIDURAL

## 2015-11-20 MED ORDER — DIBUCAINE 1 % RE OINT: 1.0000 "application " | TOPICAL_OINTMENT | RECTAL | Status: DC | PRN

## 2015-11-20 MED ORDER — LACTATED RINGERS IV SOLN: 2.5000 [IU]/h | INTRAVENOUS | Status: DC | PRN

## 2015-11-20 MED ORDER — METHYLERGONOVINE MALEATE 0.2 MG/ML IJ SOLN: 0.2000 mg | INTRAMUSCULAR | Status: DC | PRN

## 2015-11-20 MED ORDER — SENNOSIDES-DOCUSATE SODIUM 8.6-50 MG PO TABS
2.0000 | ORAL_TABLET | ORAL | Status: DC
  Administered 2015-11-21 (×2): 2 via ORAL
  Filled 2015-11-20 (×2): qty 2

## 2015-11-20 MED ORDER — OXYCODONE-ACETAMINOPHEN 5-325 MG PO TABS: 2.0000 | ORAL_TABLET | ORAL | Status: DC | PRN

## 2015-11-20 MED ORDER — TETANUS-DIPHTH-ACELL PERTUSSIS 5-2.5-18.5 LF-MCG/0.5 IM SUSP: 0.5000 mL | Freq: Once | INTRAMUSCULAR | Status: DC

## 2015-11-20 NOTE — Progress Notes (Signed)
Lang SnowLyndsay Pulcini is a 20 y.o. G1P0 at 6780w3d by LMP admitted for active labor  Subjective:   Objective: BP 133/69 mmHg  Pulse 94  Temp(Src) 97.8 F (36.6 C) (Oral)  Resp 18  Ht 5' 1.5" (1.562 m)  Wt 185 lb (83.915 kg)  BMI 34.39 kg/m2  SpO2 95%  LMP 02/17/2015      FHT:  FHR: 135 bpm, variability: moderate,  accelerations:  Present,  decelerations:  Absent UC:   regular, every 2-3 minutes SVE:   Dilation: 10 Effacement (%): 100 Station: +1 Exam by:: ansah-mensah, rnc   Labs: Lab Results  Component Value Date   WBC 13.8* 11/19/2015   HGB 9.7* 11/19/2015   HCT 30.0* 11/19/2015   MCV 80.2 11/19/2015   PLT 334 11/19/2015    Assessment / Plan: Spontaneous labor, progressing normally  Labor: Progressing normally Preeclampsia:  n/a Fetal Wellbeing:  Category I Pain Control:  Epidural I/D:  n/a Anticipated MOD:  NSVD  HARPER,CHARLES A 11/20/2015, 8:11 AM

## 2015-11-20 NOTE — Progress Notes (Signed)
Lang SnowLyndsay Scholz is a 20 y.o. G1P0 at 1075w3d by LMP admitted for active labor  Subjective:   Objective: BP 118/65 mmHg  Pulse 98  Temp(Src) 98.3 F (36.8 C) (Oral)  Resp 18  Ht 5' 1.5" (1.562 m)  Wt 185 lb (83.915 kg)  BMI 34.39 kg/m2  SpO2 95%  LMP 02/17/2015      FHT:  FHR: 135 bpm, variability: moderate,  accelerations:  Present,  decelerations:  Absent UC:   regular, every 2-3 minutes SVE:   Dilation: 6 Effacement (%): 100 Station: -1 Exam by:: ansah-mensah, rnc   Labs: Lab Results  Component Value Date   WBC 13.8* 11/19/2015   HGB 9.7* 11/19/2015   HCT 30.0* 11/19/2015   MCV 80.2 11/19/2015   PLT 334 11/19/2015    Assessment / Plan: Spontaneous labor, progressing normally  Labor: Progressing normally Preeclampsia:  n/a Fetal Wellbeing:  Category I Pain Control:  Epidural I/D:  n/a Anticipated MOD:  NSVD  HARPER,CHARLES A 11/20/2015, 4:47 AM

## 2015-11-20 NOTE — Anesthesia Preprocedure Evaluation (Signed)
Anesthesia Evaluation  Patient identified by MRN, date of birth, ID band Patient awake    Reviewed: Allergy & Precautions, Patient's Chart, lab work & pertinent test results  Airway Mallampati: III  TM Distance: >3 FB Neck ROM: Full    Dental no notable dental hx. (+) Teeth Intact   Pulmonary former smoker,    Pulmonary exam normal breath sounds clear to auscultation       Cardiovascular negative cardio ROS Normal cardiovascular exam Rhythm:Regular Rate:Normal     Neuro/Psych negative neurological ROS  negative psych ROS   GI/Hepatic Neg liver ROS, GERD  Medicated and Controlled,  Endo/Other  negative endocrine ROSObesity  Renal/GU negative Renal ROS  negative genitourinary   Musculoskeletal negative musculoskeletal ROS (+)   Abdominal (+) + obese,   Peds  Hematology  (+) anemia ,   Anesthesia Other Findings   Reproductive/Obstetrics (+) Pregnancy                             Anesthesia Physical Anesthesia Plan  ASA: II  Anesthesia Plan: Epidural   Post-op Pain Management:    Induction:   Airway Management Planned:   Additional Equipment:   Intra-op Plan:   Post-operative Plan:   Informed Consent: I have reviewed the patients History and Physical, chart, labs and discussed the procedure including the risks, benefits and alternatives for the proposed anesthesia with the patient or authorized representative who has indicated his/her understanding and acceptance.     Plan Discussed with: Anesthesiologist  Anesthesia Plan Comments:         Anesthesia Quick Evaluation

## 2015-11-20 NOTE — Anesthesia Procedure Notes (Signed)
Epidural Patient location during procedure: OB Start time: 11/20/2015 2:20 AM  Staffing Anesthesiologist: Mal AmabileFOSTER, Jadee Golebiewski Performed by: anesthesiologist   Preanesthetic Checklist Completed: patient identified, site marked, surgical consent, pre-op evaluation, timeout performed, IV checked, risks and benefits discussed and monitors and equipment checked  Epidural Patient position: sitting Prep: site prepped and draped and DuraPrep Patient monitoring: continuous pulse ox and blood pressure Approach: midline Location: L3-L4 Injection technique: LOR air  Needle:  Needle type: Tuohy  Needle gauge: 17 G Needle length: 9 cm and 9 Needle insertion depth: 4 cm Catheter type: closed end flexible Catheter size: 19 Gauge Catheter at skin depth: 9 cm Test dose: negative and Other  Assessment Events: blood not aspirated, injection not painful, no injection resistance, negative IV test and no paresthesia  Additional Notes Patient identified. Risks and benefits discussed including failed block, incomplete  Pain control, post dural puncture headache, nerve damage, paralysis, blood pressure Changes, nausea, vomiting, reactions to medications-both toxic and allergic and post Partum back pain. All questions were answered. Patient expressed understanding and wished to proceed. Sterile technique was used throughout procedure. Epidural site was Dressed with sterile barrier dressing. No paresthesias, signs of intravascular injection Or signs of intrathecal spread were encountered.  Patient was more comfortable after the epidural was dosed. Please see RN's note for documentation of vital signs and FHR which are stable.

## 2015-11-20 NOTE — Lactation Note (Signed)
This note was copied from a baby's chart. Lactation Consultation Note Initial visit at 8 hours of age.  Mom plans to pump and bottle feed and is getting formula until mom can express for baby.  Rn set up DEBP.  Discussed with mom why she may not see colostrum when she pumps and encouraged mom to pump 8X/24 hours to establish a good supply.  Encouraged mom to work on hand expression after pumping to collect and give to baby.   LC offered to assist with hand expression and mom declines at this time.  [facility] resources given and discussed.  Encouraged to feed with early cues on demand.  Early newborn behavior discussed. Discussed EBM storage at room temperature when freshly expressed.   Mom to call for assist as needed.    Patient Name: Rita Gilbert WUJWJ'XToday's Date: 11/20/2015 Reason for consult: Initial assessment   Maternal Data Has patient been taught Hand Expression?: Yes (per mom Rn instructed declined at this time) Does the patient have breastfeeding experience prior to this delivery?: No  Feeding Feeding Type: Bottle Fed - Formula Nipple Type: Slow - flow  LATCH Score/Interventions                      Lactation Tools Discussed/Used WIC Program: Yes (Sent fax) Pump Review: Milk Storage Initiated by:: RN Date initiated:: 11/20/15   Consult Status Consult Status: Follow-up Date: 11/21/15 Follow-up type: In-patient    Rita Gilbert, Rita Gilbert 11/20/2015, 5:15 PM

## 2015-11-20 NOTE — Anesthesia Postprocedure Evaluation (Signed)
Anesthesia Post Note  Patient: Rita Gilbert  Procedure(s) Performed: * No procedures listed *  Patient location during evaluation: Mother Baby Anesthesia Type: Epidural Level of consciousness: awake and alert Pain management: pain level controlled Vital Signs Assessment: post-procedure vital signs reviewed and stable Respiratory status: spontaneous breathing Cardiovascular status: stable Postop Assessment: patient able to bend at knees, no signs of nausea or vomiting and adequate PO intake Anesthetic complications: no    Last Vitals:  Filed Vitals:   11/20/15 1100 11/20/15 1200  BP: 123/66 117/68  Pulse: 70 72  Temp: 37.6 C 36.4 C  Resp: 20 16    Last Pain:  Filed Vitals:   11/20/15 1331  PainSc: 0-No pain                 Rielly Brunn Hristova

## 2015-11-21 LAB — CBC
HCT: 30.2 % — ABNORMAL LOW (ref 36.0–46.0)
Hemoglobin: 9.8 g/dL — ABNORMAL LOW (ref 12.0–15.0)
MCH: 26 pg (ref 26.0–34.0)
MCHC: 32.5 g/dL (ref 30.0–36.0)
MCV: 80.1 fL (ref 78.0–100.0)
PLATELETS: 282 10*3/uL (ref 150–400)
RBC: 3.77 MIL/uL — ABNORMAL LOW (ref 3.87–5.11)
RDW: 16.5 % — AB (ref 11.5–15.5)
WBC: 11.9 10*3/uL — AB (ref 4.0–10.5)

## 2015-11-21 LAB — RPR: RPR Ser Ql: NONREACTIVE

## 2015-11-21 MED ORDER — RHO D IMMUNE GLOBULIN 1500 UNIT/2ML IJ SOSY
300.0000 ug | PREFILLED_SYRINGE | Freq: Once | INTRAMUSCULAR | Status: AC
  Administered 2015-11-21: 300 ug via INTRAMUSCULAR
  Filled 2015-11-21: qty 2

## 2015-11-21 NOTE — Progress Notes (Signed)
Post Partum Day #1 Subjective: no complaints, up ad lib, voiding and tolerating PO  Objective: Blood pressure 108/76, pulse 72, temperature 98.1 F (36.7 C), temperature source Oral, resp. rate 18, height 5' 1.5" (1.562 m), weight 185 lb (83.915 kg), last menstrual period 02/17/2015, SpO2 95 %, unknown if currently breastfeeding.  Physical Exam:  General: alert, cooperative and no distress Lochia: appropriate Uterine Fundus: firm Incision: none DVT Evaluation: No evidence of DVT seen on physical exam. No cords or calf tenderness. No significant calf/ankle edema.   Recent Labs  11/19/15 2300 11/21/15 0537  HGB 9.7* 9.8*  HCT 30.0* 30.2*    Assessment/Plan: Plan for discharge tomorrow and Breastfeeding   LOS: 2 days   Rita Gilbert A Emelio Schneller 11/21/2015, 8:03 AM

## 2015-11-22 LAB — RH IG WORKUP (INCLUDES ABO/RH)
ABO/RH(D): O NEG
FETAL SCREEN: NEGATIVE
GESTATIONAL AGE(WKS): 39.3
Unit division: 0

## 2015-11-22 NOTE — Discharge Summary (Signed)
Obstetric Discharge Summary Reason for Admission: onset of labor Prenatal Procedures: none Intrapartum Procedures: spontaneous vaginal delivery Postpartum Procedures: none Complications-Operative and Postpartum: none HEMOGLOBIN  Date Value Ref Range Status  11/21/2015 9.8* 12.0 - 15.0 g/dL Final   HCT  Date Value Ref Range Status  11/21/2015 30.2* 36.0 - 46.0 % Final    Physical Exam:  General: alert Lochia: appropriate Uterine Fundus: firm Incision: healing well DVT Evaluation: No evidence of DVT seen on physical exam.  Discharge Diagnoses: Term Pregnancy-delivered  Discharge Information: Date: 11/22/2015 Activity: pelvic rest Diet: routine Medications: Percocet Condition: stable Instructions: refer to practice specific booklet Discharge to: home Follow-up Information    Follow up with Rita A, MD.   Specialty:  Obstetrics and Gynecology   Contact information:   [address] [place] [id number] [phone]       Newborn Data: Live born female  Birth Weight: 7 lb 1.9 oz (3229 g) APGAR: 9, 9  Home with mother.  Rita Gilbert Gilbert 11/22/2015, 6:15 AM

## 2015-11-22 NOTE — Lactation Note (Signed)
This note was copied from a baby's chart. Lactation Consultation Note  Mom has a hand pump but has not pumped since yesterday because nothing came out. Explained normal progression of milk supply and how it is necessary to stimulate the breast the risk of engorgment . Encouraged her to pump every 3 hours for 15 minutes even at night.  Understanding verbalized  Patient Name: Rita Gilbert WUJWJ'XToday's Date: 11/22/2015     Maternal Data    Feeding Feeding Type: Formula Nipple Type: Slow - flow  LATCH Score/Interventions                      Lactation Tools Discussed/Used     Consult Status      Soyla DryerJoseph, Cort Dragoo 11/22/2015, 10:43 AM

## 2015-11-22 NOTE — Progress Notes (Signed)
Patient ID: Rita Gilbert, female   DOB: 12-09-1995, 20 y.o.   MRN: [id number] Postpartum day 2 Blood pressure 124/65 pulse 85 respirations 16 Fundus firm Lochia moderate Legs negative home today

## 2015-11-22 NOTE — Discharge Instructions (Signed)
Discharge instructions   You can wash your hair  Shower  Eat what you want  Drink what you want  See me in 6 weeks  Your ankles are going to swell more in the next 2 weeks than when pregnant  No sex for 6 weeks   Adam Demary A, MD 11/22/2015

## 2015-11-24 ENCOUNTER — Encounter: Payer: Medicaid Other | Admitting: Obstetrics

## 2015-12-07 IMAGING — US US OB COMP LESS 14 WK
1 series · 16 of 28 positions shown · non-contrast
Comparison: None.

CLINICAL DATA: Pregnant, assess for viability, no fetal heart tones
on in-office Doppler

EXAM:
OBSTETRIC <14 WK ULTRASOUND
TECHNIQUE: Transabdominal ultrasound was performed for evaluation of the
gestation as well as the maternal uterus and adnexal regions.

[Series 1: us ob comp less 14 wk · 33 acquisitions, 16 frames shown]
[im 1/33]
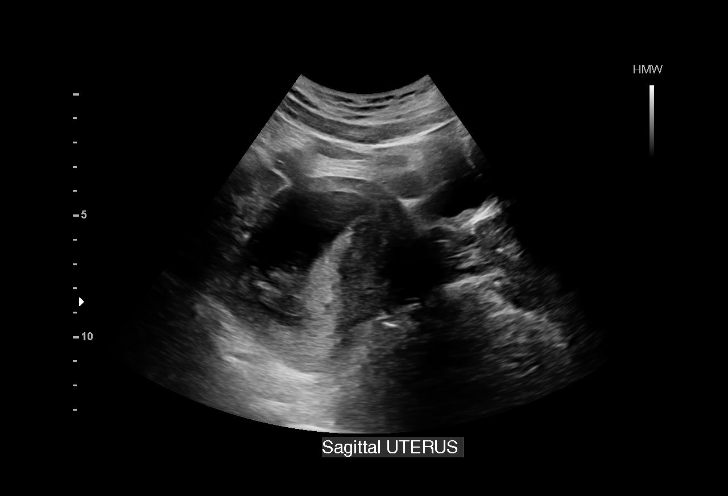
[im 3/33]
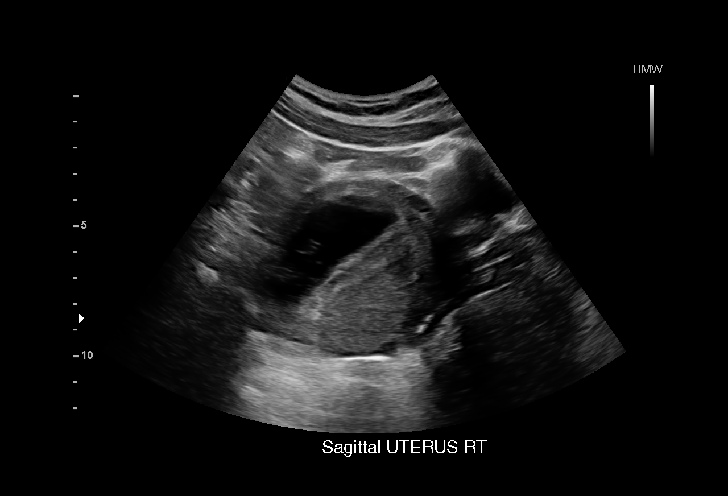
[im 5/33]
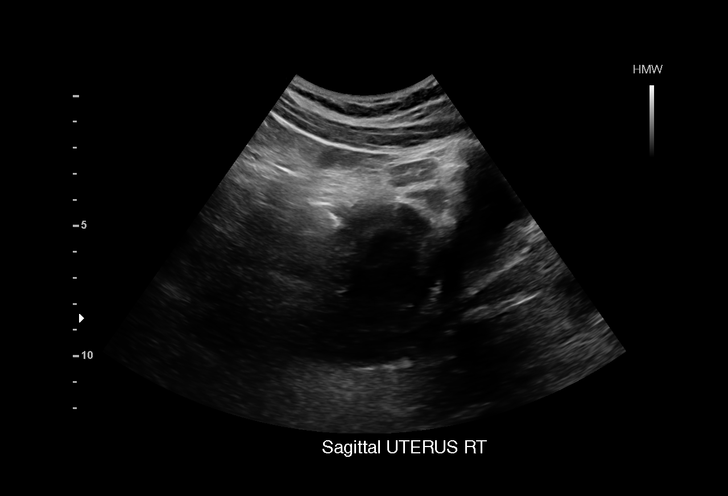
[im 8/33]
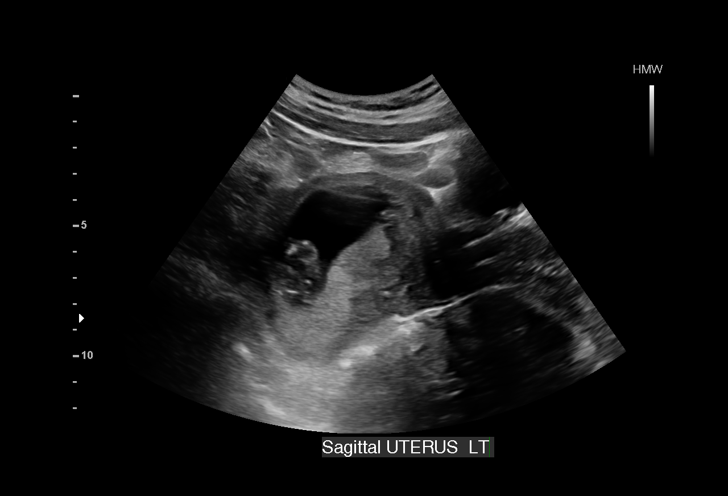
[im 9/33]
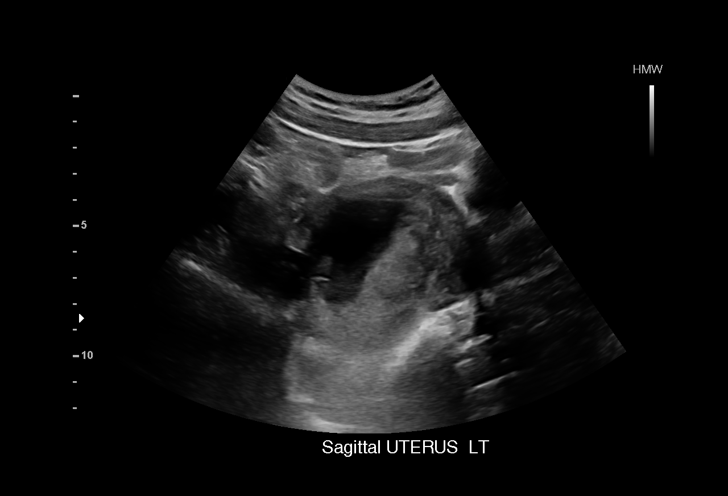
[im 11/33]
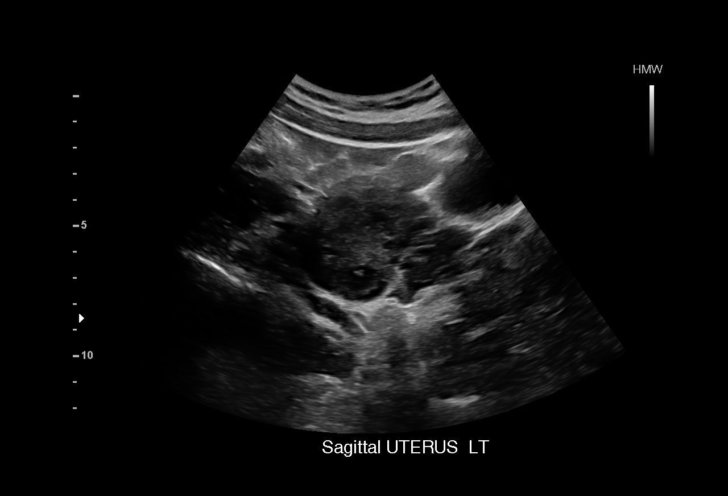
[im 14/33]
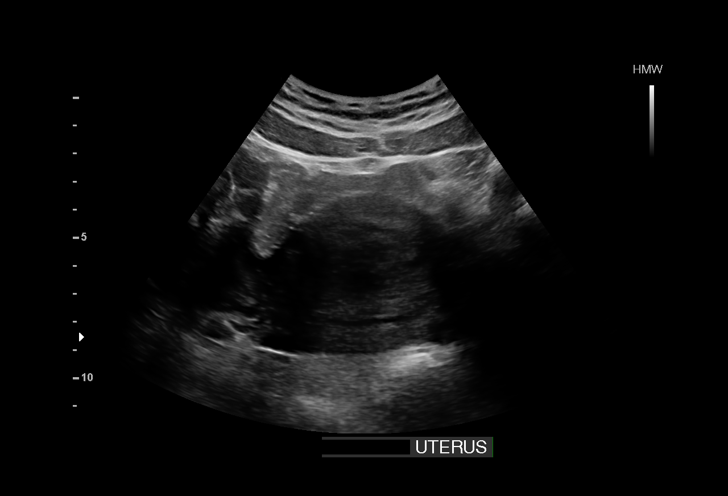
[im 16/33]
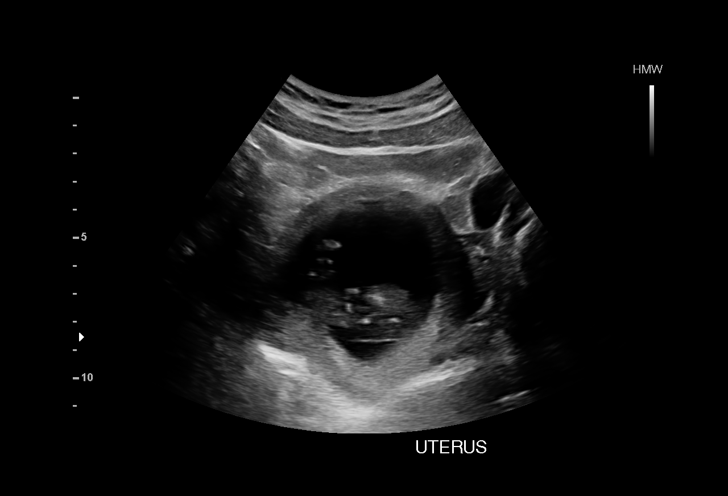
[im 17/33]
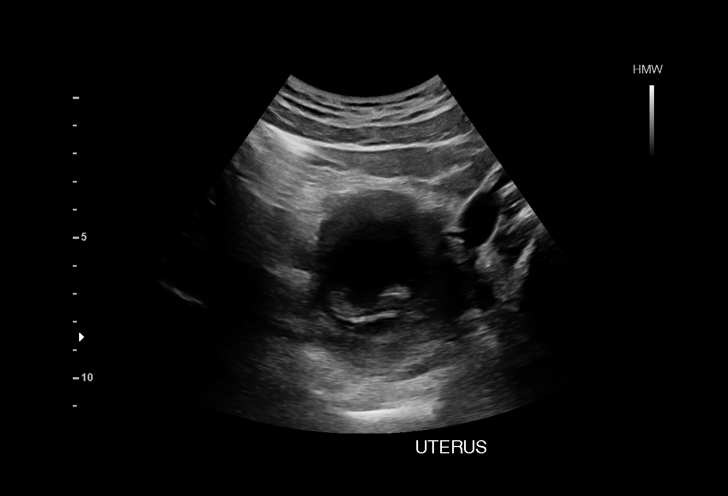
[im 19/33]
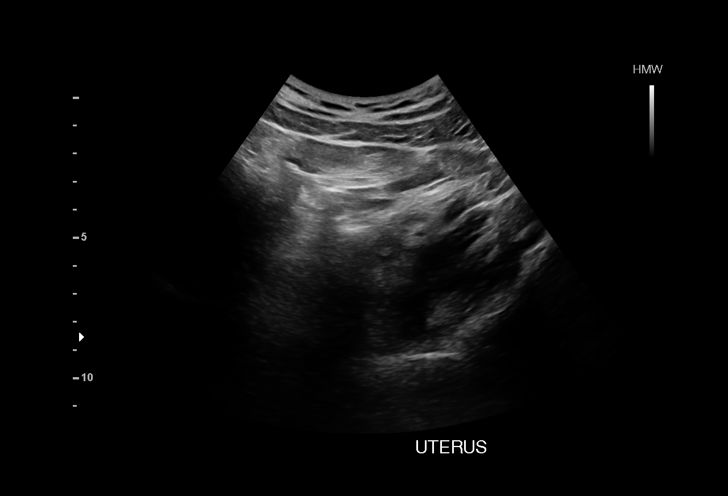
[im 22/33]
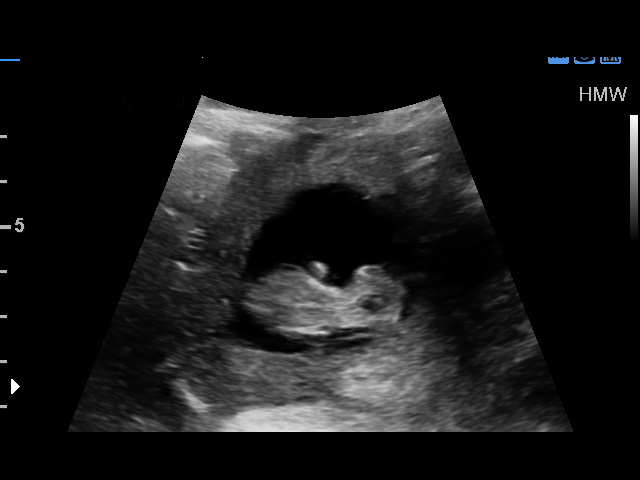
[im 24/33]
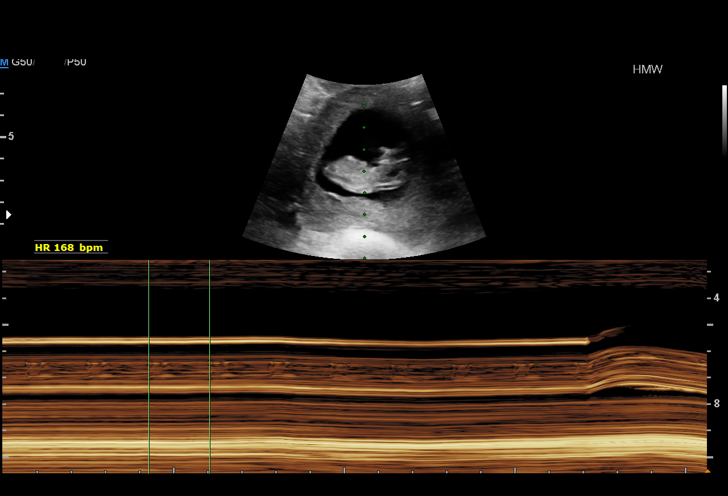
[im 25/33]
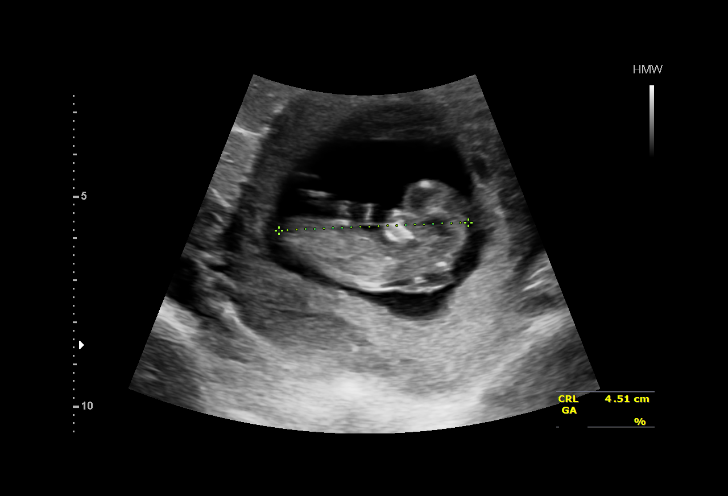
[im 28/33]
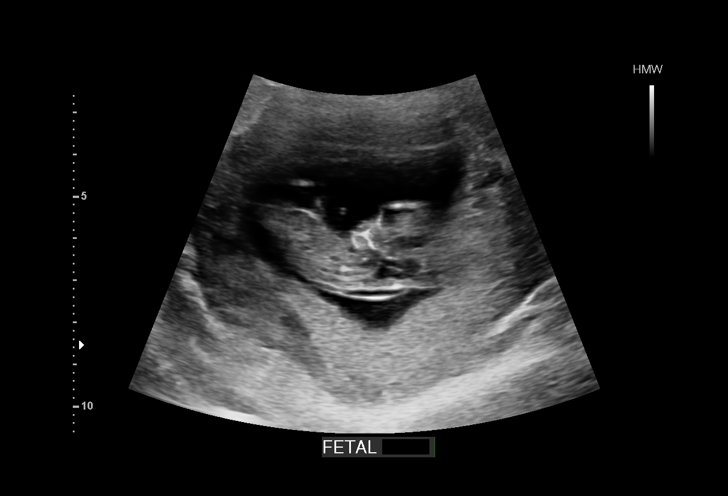
[im 30/33]
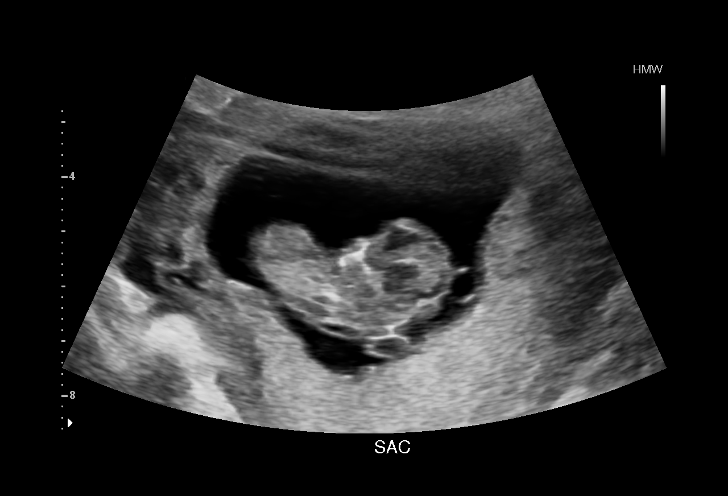
[im 33/33]
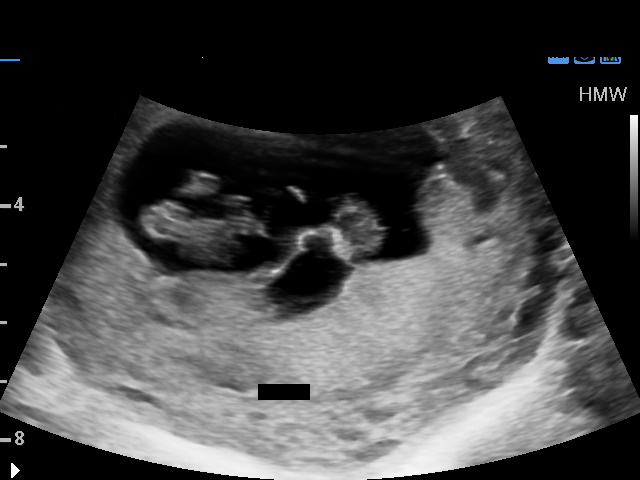

[16 of 28 positions shown; findings below may reference images not displayed]

FINDINGS: Intrauterine gestational sac: Visualized/normal in shape.

Yolk sac:  Present

Embryo:  Present

Cardiac Activity: Present

Heart Rate: 168 bpm

CRL:   45.5  mm   11 w 3 d                  US EDC: 11/21/2015

Maternal uterus/adnexae: No subchronic hemorrhage.

Bilateral ovaries are not discretely visualized.

No free fluid.
IMPRESSION: Single live intrauterine gestation with estimated gestational age 11
weeks 3 days by crown-rump length.

## 2015-12-08 ENCOUNTER — Ambulatory Visit (INDEPENDENT_AMBULATORY_CARE_PROVIDER_SITE_OTHER): Payer: Medicaid Other | Admitting: Obstetrics

## 2015-12-08 ENCOUNTER — Encounter: Payer: Self-pay | Admitting: Obstetrics

## 2015-12-08 VITALS — BP 105/73 | HR 85 | Temp 98.4°F | Wt 166.0 lb

## 2015-12-08 DIAGNOSIS — D508 Other iron deficiency anemias: Secondary | ICD-10-CM

## 2015-12-08 MED ORDER — VITAFOL FE+ 90-1-200 & 50 MG PO CPPK: 1.0000 | ORAL_CAPSULE | ORAL | Status: DC

## 2015-12-08 NOTE — Progress Notes (Signed)
Subjective:     Rita Gilbert is a 20 y.o. female who presents for a postpartum visit. She is 3 weeks postpartum following a spontaneous vaginal delivery. I have fully reviewed the prenatal and intrapartum course. The delivery was at 39 gestational weeks. Outcome: spontaneous vaginal delivery. Anesthesia: epidural. Postpartum course has been normal. Baby's course has been normal. Baby is feeding by bottle - Similac Advance. Bleeding thin lochia. Bowel function is normal. Bladder function is normal. Patient is not sexually active. Contraception method is abstinence. Postpartum depression screening: negative.  Tobacco, alcohol and substance abuse history reviewed.  Adult immunizations reviewed including TDAP, rubella and varicella.  The following portions of the patient's history were reviewed and updated as appropriate: allergies, current medications, past family history, past medical history, past social history, past surgical history and problem list.  Review of Systems A comprehensive review of systems was negative.   Objective:    BP 105/73 mmHg  Pulse 85  Temp(Src) 98.4 F (36.9 C)  Wt 166 lb (75.297 kg)    100% of 10 min visit spent on counseling and coordination of care.  Assessment:    3 weeks postpartum.  Doing well.  Contraceptive counseling and advice. Plan:    1. Contraception: Nexplanon 2. Nexplanon Rx 3. Follow up in: 2 weeks or as needed.   Healthy lifestyle practices reviewed

## 2015-12-23 ENCOUNTER — Other Ambulatory Visit: Payer: Self-pay | Admitting: *Deleted

## 2015-12-23 DIAGNOSIS — D508 Other iron deficiency anemias: Secondary | ICD-10-CM

## 2015-12-23 MED ORDER — VITAFOL FE+ 90-1-200 & 50 MG PO CPPK: 2.0000 | ORAL_CAPSULE | Freq: Every day | ORAL | Status: AC

## 2015-12-29 ENCOUNTER — Encounter: Payer: Self-pay | Admitting: Pediatrics

## 2015-12-29 ENCOUNTER — Ambulatory Visit (INDEPENDENT_AMBULATORY_CARE_PROVIDER_SITE_OTHER): Payer: Medicaid Other | Admitting: Pediatrics

## 2015-12-29 ENCOUNTER — Other Ambulatory Visit: Payer: Self-pay | Admitting: Pediatrics

## 2015-12-29 ENCOUNTER — Other Ambulatory Visit: Payer: Self-pay | Admitting: Obstetrics

## 2015-12-29 VITALS — BP 125/71 | HR 92 | Ht 61.02 in | Wt 161.6 lb

## 2015-12-29 DIAGNOSIS — Z3046 Encounter for surveillance of implantable subdermal contraceptive: Secondary | ICD-10-CM | POA: Insufficient documentation

## 2015-12-29 DIAGNOSIS — Z3049 Encounter for surveillance of other contraceptives: Secondary | ICD-10-CM | POA: Diagnosis not present

## 2015-12-29 DIAGNOSIS — Z113 Encounter for screening for infections with a predominantly sexual mode of transmission: Secondary | ICD-10-CM | POA: Diagnosis not present

## 2015-12-29 DIAGNOSIS — Z30017 Encounter for initial prescription of implantable subdermal contraceptive: Secondary | ICD-10-CM

## 2015-12-29 DIAGNOSIS — Z3202 Encounter for pregnancy test, result negative: Secondary | ICD-10-CM | POA: Diagnosis not present

## 2015-12-29 LAB — POCT URINE PREGNANCY: PREG TEST UR: NEGATIVE

## 2015-12-29 MED ORDER — ETONOGESTREL 68 MG ~~LOC~~ IMPL
68.0000 mg | DRUG_IMPLANT | Freq: Once | SUBCUTANEOUS | Status: AC
  Administered 2015-12-29: 68 mg via SUBCUTANEOUS

## 2015-12-29 NOTE — Telephone Encounter (Signed)
Please review

## 2015-12-29 NOTE — Progress Notes (Signed)
THIS RECORD MAY CONTAIN CONFIDENTIAL INFORMATION THAT SHOULD NOT BE RELEASED WITHOUT REVIEW OF THE SERVICE PROVIDER.  Adolescent Medicine Consultation Follow-Up Visit Rita Gilbert  is a 20 y.o. female referred by No ref. provider found here today for follow-up.    Previsit planning completed:  no  Growth Chart Viewed? yes   History was provided by the patient.  PCP Confirmed?  No PCP. Advised to call [facility] and establish care.   My Chart Activated?   no   HPI:   Referred by Dr. Remonia Richter to consult with patient regarding contraceptive options. Her son was being seen for [facility].  LMP was reviewed, as well as cycle history.  Sexual history was discussed, including current contraception.  Patient is not currently sexually active.  Risks and benefits of First and Second Tier contraceptive options were discussed. Patient verbalized understanding of available contraception choices and desired Nexplanon placement.   Taking OCP right now. She is not breastfeeding. She was meant to have nexplanon with Dr. Clearance Coots but hasn't heard from his office regarding an appointment.   Patient's other goals for contraception include none. She would not like another child for a few years.   Detailed discussion about the unpredictable vaginal bleeding associated with Nexplanon within the first 30 days through the first 6 months of product use was discussed.  Patient verbalized understanding of bleeding and was also educated on signs of worrisome or heavy bleeding that would warrant further follow-up.  Patient was also advised to use back-up contraception for the next 7 days. She is currently on OCP and will continue for next 7 days although she is currently abstinent.  Condoms were provided to patient and STI protection was addressed.  Patient had no further questions and procedure was completed per procedure note.   Review of Systems  Constitutional: Negative for weight loss and malaise/fatigue.  Eyes:  Negative for blurred vision.  Respiratory: Negative for shortness of breath.   Cardiovascular: Negative for chest pain and palpitations.  Gastrointestinal: Negative for nausea, vomiting, abdominal pain and constipation.  Genitourinary: Negative for dysuria.  Musculoskeletal: Negative for myalgias.  Neurological: Negative for dizziness and headaches.  Psychiatric/Behavioral: Negative for depression.    Patient's last menstrual period was 03/15/2015. Allergies  Allergen Reactions  . Peanut-Containing Drug Products Anaphylaxis   Outpatient Prescriptions Prior to Visit  Medication Sig Dispense Refill  . Prenat-FePoly-Metf-FA-DHA-DSS (VITAFOL FE+) 90-1-200 & 50 MG CPPK Take 2 tablets by mouth daily. 60 each 11   No facility-administered medications prior to visit.     Patient Active Problem List   Diagnosis Date Noted  . NSVD (normal spontaneous vaginal delivery) 11/20/2015  . Normal labor and delivery 11/19/2015      The following portions of the patient's history were reviewed and updated as appropriate: allergies, current medications, past family history, past medical history, past social history and problem list.  Physical Exam:  Filed Vitals:   12/29/15 1113  BP: 125/71  Pulse: 92  Height: 5' 1.02" (1.55 m)  Weight: 161 lb 9.6 oz (73.301 kg)   BP 125/71 mmHg  Pulse 92  Ht 5' 1.02" (1.55 m)  Wt 161 lb 9.6 oz (73.301 kg)  BMI 30.51 kg/m2  LMP 03/15/2015 Body mass index: body mass index is 30.51 kg/(m^2). Facility age limit for growth percentiles is 20 years.  Physical Exam  Constitutional: She is oriented to person, place, and time. She appears well-developed and well-nourished.  HENT:  Head: Normocephalic.  Neck: No thyromegaly present.  Cardiovascular: Normal rate, regular rhythm, normal heart sounds and intact distal pulses.   Pulmonary/Chest: Effort normal and breath sounds normal.  Abdominal: Soft. Bowel sounds are normal. There is no tenderness.   Musculoskeletal: Normal range of motion.  Neurological: She is alert and oriented to person, place, and time.  Skin: Skin is warm and dry.  Significant eczema  Psychiatric: She has a normal mood and affect.    Assessment/Plan: 1. Insertion of Nexplanon See procedure note. Tolerated well. No questions. She has no primary care physician but advised her to f/u with family medicine for an appointment. We can follow her as needed for concerns with nexplanon prior to turning 22. Advised family med can remove if she is over 22 as can GYN.  - etonogestrel (NEXPLANON) implant 68 mg; 68 mg by Subdermal route once. - Subdermal Etonogestrel Implant Insertion; Standing - Subdermal Etonogestrel Implant Insertion  2. Pregnancy examination or test, negative result Negative. Not breast feeding but not sexually active.  - POCT urine pregnancy  3. Routine screening for STI (sexually transmitted infection) Per protocol.    Follow-up:  PRN with questions or concerns about nexplanon   Medical decision-making:  > 25 minutes spent, more than 50% of appointment was spent discussing diagnosis and management of symptoms

## 2015-12-29 NOTE — Patient Instructions (Signed)
Follow-up with Dr. Perry in 1 month. Schedule this appointment before you leave clinic today.  Congratulations on getting your Nexplanon placement!  Below is some important information about Nexplanon.  First remember that Nexplanon does not prevent sexually transmitted infections.  Condoms will help prevent sexually transmitted infections. The Nexplanon starts working 7 days after it was inserted.  There is a risk of getting pregnant if you have unprotected sex in those first 7 days after placement of the Nexplanon.  The Nexplanon lasts for 3 years but can be removed at any time.  You can become pregnant as early as 1 week after removal.  You can have a new Nexplanon put in after the old one is removed if you like.  It is not known whether Nexplanon is as effective in women who are very overweight because the studies did not include many overweight women.  Nexplanon interacts with some medications, including barbiturates, bosentan, carbamazepine, felbamate, griseofulvin, oxcarbazepine, phenytoin, rifampin, St. John's wort, topiramate, HIV medicines.  Please alert your doctor if you are on any of these medicines.  Always tell other healthcare providers that you have a Nexplanon in your arm.  The Nexplanon was placed just under the skin.  Leave the outside bandage on for 24 hours.  Leave the smaller bandage on for 3-5 days or until it falls off on its own.  Keep the area clean and dry for 3-5 days. There is usually bruising or swelling at the insertion site for a few days to a week after placement.  If you see redness or pus draining from the insertion site, call us immediately.  Keep your user card with the date the implant was placed and the date the implant is to be removed.  The most common side effect is a change in your menstrual bleeding pattern.   This bleeding is generally not harmful to you but can be annoying.  Call or come in to see us if you have any concerns about the bleeding or if  you have any side effects or questions.    We will call you in 1 week to check in and we would like you to return to the clinic for a follow-up visit in 1 month.  You can call [facility] 24 hours a day with any questions or concerns.  There is always a nurse or doctor available to take your call.  Call 9-1-1 if you have a life-threatening emergency.  For anything else, please call us at [phone] before heading to the ER.  

## 2015-12-29 NOTE — Procedures (Signed)
Nexplanon Insertion  No contraindications for placement.  No liver disease, no unexplained vaginal bleeding, no h/o breast cancer, no h/o blood clots.  Patient's last menstrual period was 03/15/2015.  UHCG: Negative   Last Unprotected sex:  Prior to pregnancy   Risks & benefits of Nexplanon discussed The nexplanon device was [facility] and [facility] Packaging instructions supplied to patient Consent form signed  The patient denies any allergies to anesthetics or antiseptics.  Procedure: Pt was placed in supine position. The left arm was flexed at the elbow and externally rotated so that her wrist was parallel to her ear The medial epicondyle of the left arm was identified The insertions site was marked 8 cm proximal to the medial epicondyle The insertion site was cleaned with Betadine The area surrounding the insertion site was covered with a sterile drape 1% lidocaine was injected just under the skin at the insertion site extending 4 cm proximally. The sterile preloaded disposable Nexaplanon applicator was removed from the sterile packaging The applicator needle was inserted at a 30 degree angle at 8 cm proximal to the medial epicondyle as marked The applicator was lowered to a horizontal position and advanced just under the skin for the full length of the needle The slider on the applicator was retracted fully while the applicator remained in the same position, then the applicator was removed. The implant was confirmed via palpation as being in position The implant position was demonstrated to the patient and confirmed by CMA.  Pressure dressing was applied to the patient.  The patient was instructed to removed the pressure dressing in 24 hrs.  The patient was advised to move slowly from a supine to an upright position  The patient denied any concerns or complaints  The patient was instructed to schedule a follow-up appt in 1 month and to call sooner if any  concerns.  The patient acknowledged agreement and understanding of the plan.

## 2016-02-04 IMAGING — US US MFM OB COMP +14 WKS
1 series · 14 of 28 positions shown · non-contrast
Comparison: none

[Series 1: us mfm ob comp +14 wks · 14 of 125 slices shown]
[im 5/125]
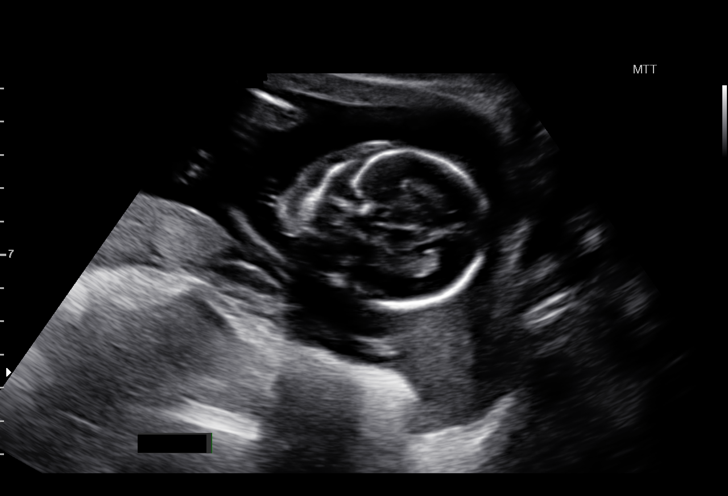
[im 14/125]
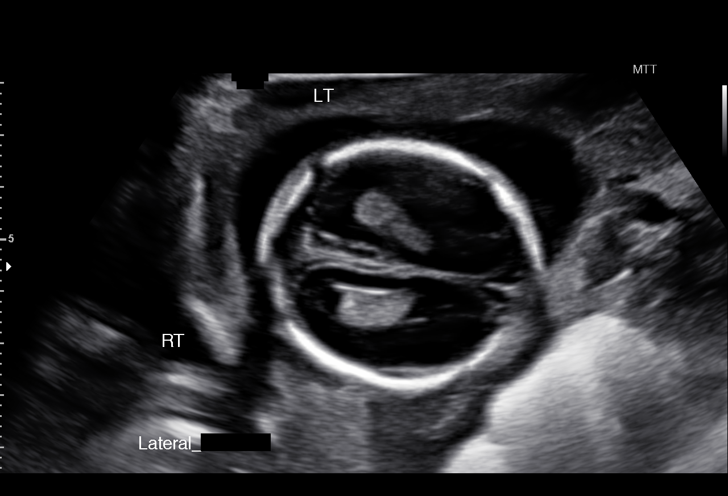
[im 23/125]
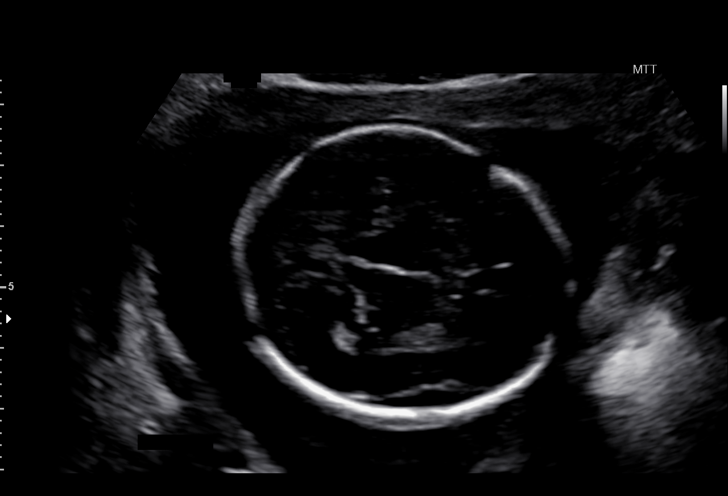
[im 33/125]
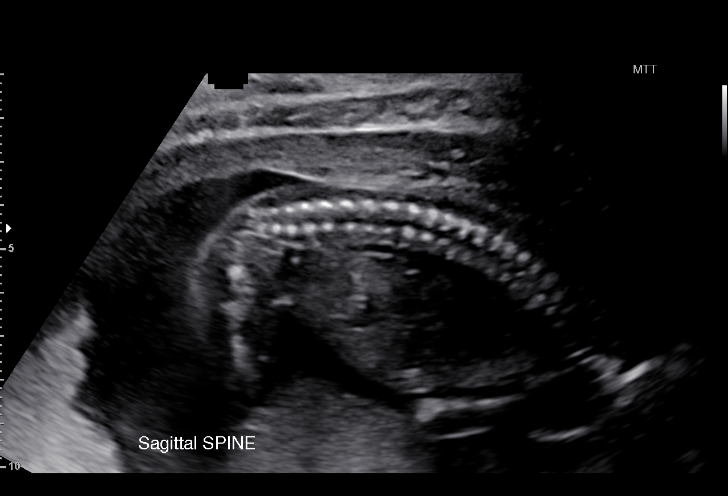
[im 42/125]
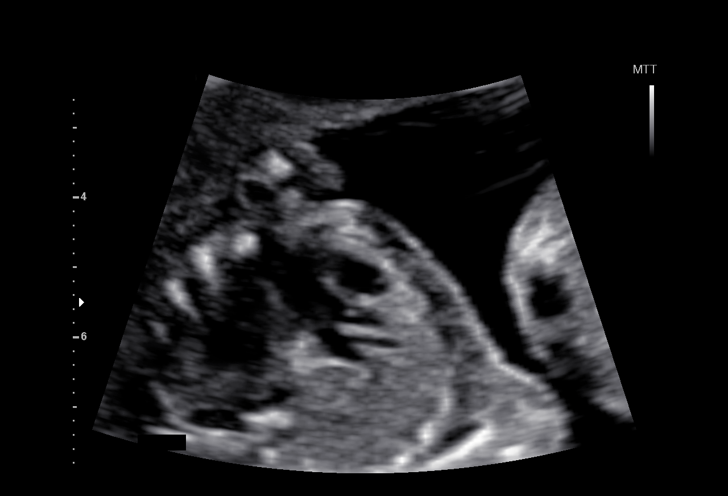
[im 51/125]
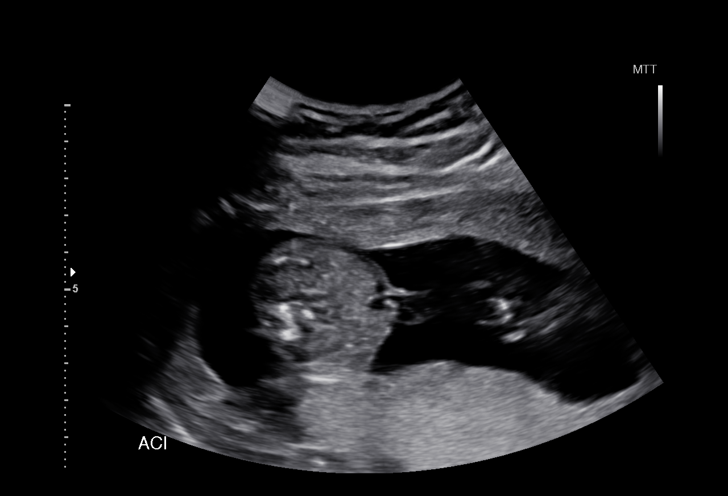
[im 60/125]
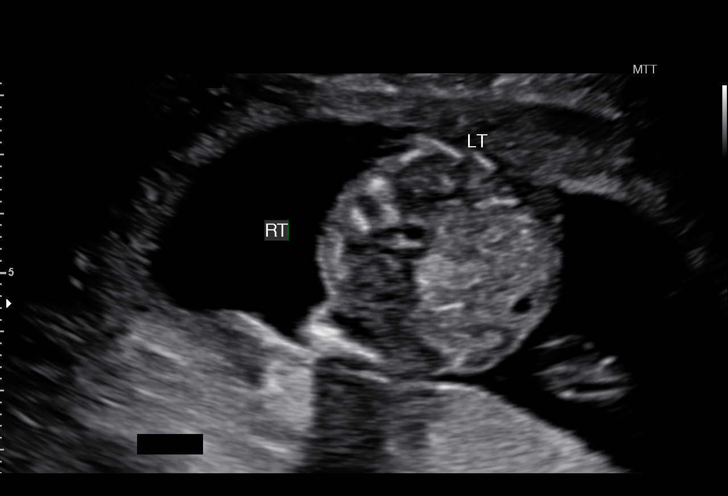
[im 69/125]
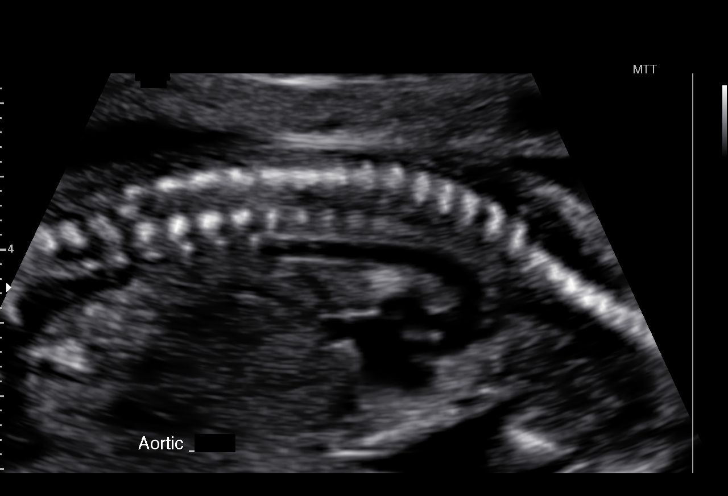
[im 79/125]
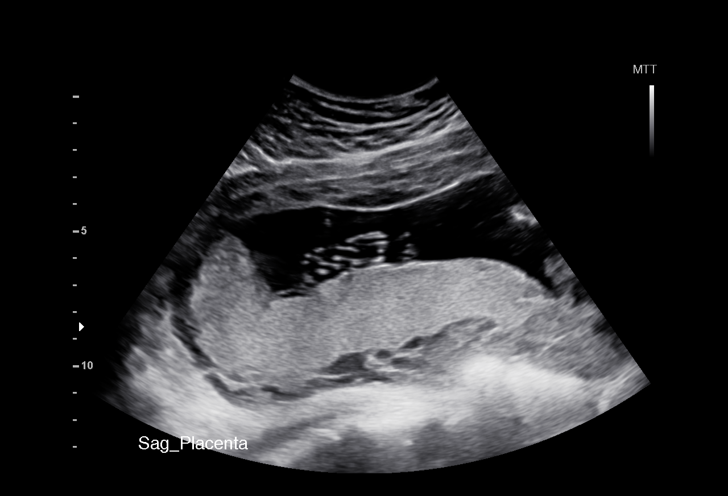
[im 88/125]
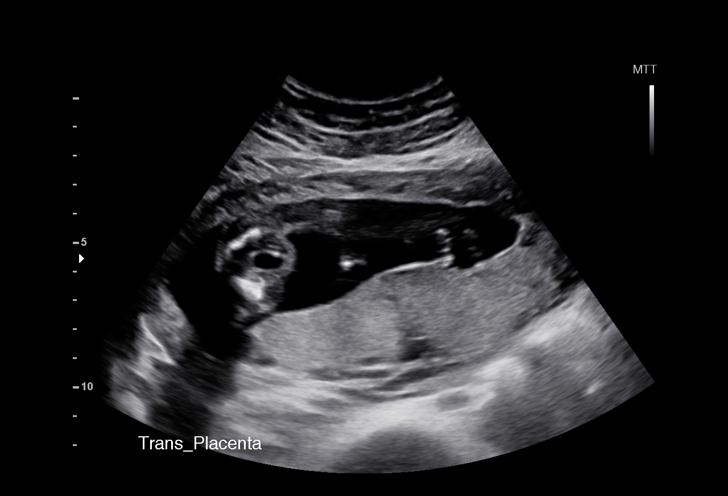
[im 97/125]
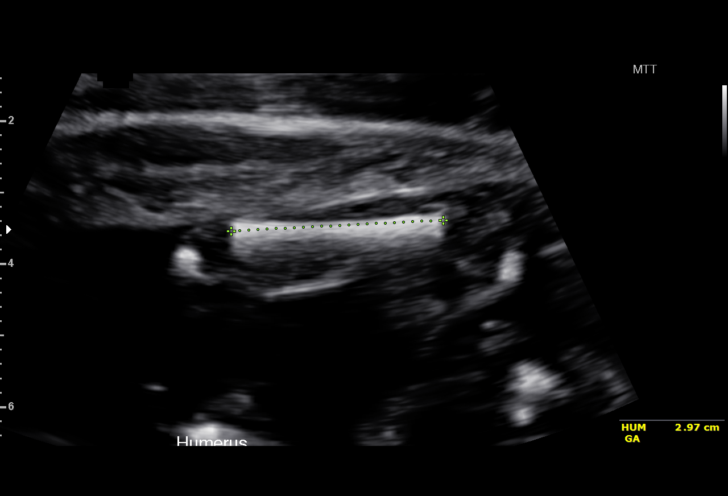
[im 106/125]
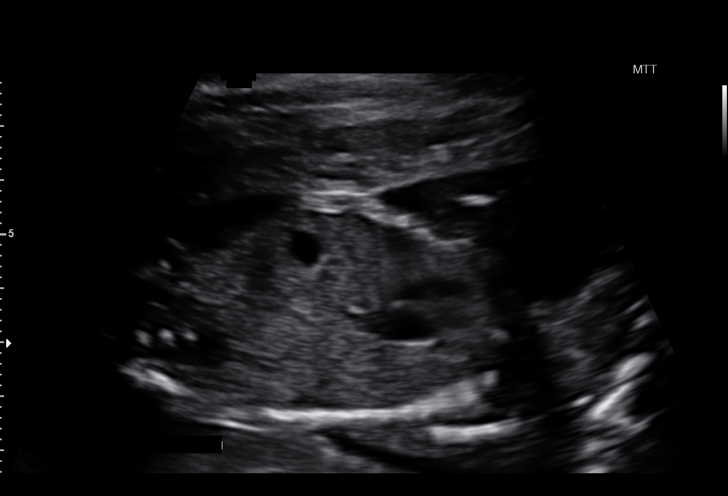
[im 115/125]
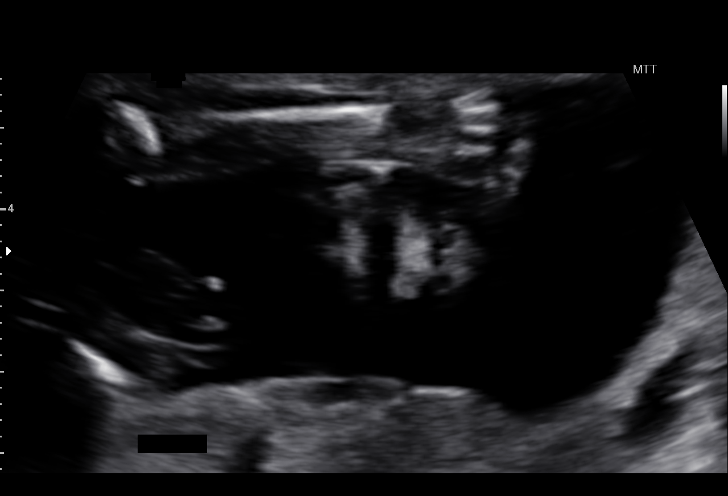
[im 125/125]
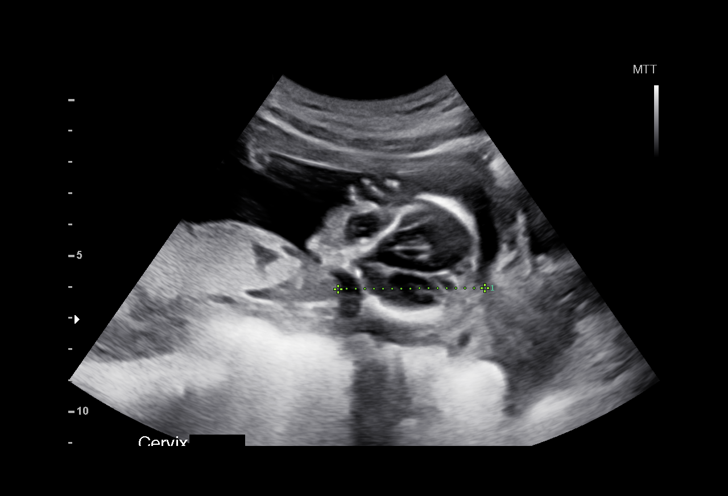

[14 of 28 positions shown; findings below may reference images not displayed]

OBSTETRICS REPORT
(Signed Final 07/03/2015 [DATE])

Name:       FIHOGIFIG BARON COREA                         Visit  07/03/2015 [DATE]
Date:

Service(s) Provided

Indications

19 weeks gestation of pregnancy
Basic anatomic survey                                  Z36
Rh negative state in antepartum state
Fetal Evaluation

Num Of             1
Fetuses:
Fetal Heart        144                          bpm
Rate:
Cardiac Activity:  Observed
Presentation:      Cephalic
Placenta:          Posterior, above cervical
os
P. Cord            Visualized, central
Insertion:

Amniotic Fluid
AFI FV:      Subjectively within normal limits
Larg Pckt:    4.42   cm
Biometry

BPD:     46.6   m    G. Age:   20w 1d                 CI:        80.45   70 - 86
m
FL/HC:      18.3   16.1 -
18.3
HC:     164.1   m    G. Age:   19w 1d        30  %    HC/AC:      1.18   1.09 -
m
AC:     139.6   m    G. Age:   19w 3d        43  %    FL/BPD
m                                     :
FL:      30.1   m    G. Age:   19w 2d        38  %    FL/AC:      21.6   20 - 24
m
HUM:     29.2   m    G. Age:   19w 4d        54  %
m
CER:     18.9   m    G. Age:   18w 3d        23  %
m
NFT:       3.8  m
m

Est.         287   gm   0 lb 10 oz      45   %
FW:
Gestational Age

LMP:           19w 3d        Date:  02/17/15                  EDD:   11/24/15
U/S Today:     19w 4d                                         EDD:   11/23/15
Best:          19w 3d    Det. By:   LMP  (02/17/15)           EDD:   11/24/15
Anatomy

Cranium:          Appears normal         Aortic Arch:       Appears normal
Fetal Cavum:      Appears normal         Ductal Arch:       Appears normal
Ventricles:       Appears normal         Diaphragm:         Appears normal
Choroid Plexus:   Appears normal         Stomach:           Appears normal,
left sided
Cerebellum:       Appears normal         Abdomen:           Appears normal
Posterior         Appears normal         Abdominal          Appears nml (cord
Fossa:                                   Wall:              insert, abd wall)
Nuchal Fold:      Appears normal         Cord Vessels:      Appears normal (3
vessel cord)
Face:             Appears normal         Kidneys:           Appear normal
(orbits and profile)
Lips:             Appears normal         Bladder:           Appears normal
Heart:            Appears normal         Spine:             Appears normal
(4CH, axis, and
situs)
RVOT:             Appears normal         Lower              Appears normal
Extremities:
LVOT:             Appears normal         Upper              Appears normal
Extremities:

Other:   Fetus appears to be a male. Heels and 5th digit visualized. Nasal
bone visualized.
Targeted Anatomy

Fetal Central Nervous System
Lat. Ventricles:  5.4                    Cisterna
Magna:
Cervix Uterus Adnexa

Cervical Length:    3.16      cm

Cervix:       Normal appearance by transabdominal scan.
Uterus:       No abnormality visualized.
Cul De Sac:   No free fluid seen.

Left Ovary:    Size(cm) L: 2.34 x W: 1.82 x H: 1.37  Volume(cc):
3.1 Within normal limits.
Right Ovary:   Size(cm) L: 2.11 x W: 1.53 x H: 2.46  Volume(cc):
4.2 Within normal limits.

Adnexa:     No adnexal mass visualized.
Impression

Single IUP at 19w 3d
Normal fetal anatomic survey
Ultrasound measurements are consistent with LMP
Posterior placenta without previa
Normal amniotic fluid volume
Recommendations

Follow-up ultrasounds as clinically indicated.
